# Patient Record
Sex: Female | Born: 1960 | Race: White | Hispanic: No | Marital: Married | Smoking: Never smoker
Health system: Southern US, Community
[De-identification: ages and names within clinical notes are randomized; demographics above are authoritative.]

## PROBLEM LIST (undated history)

## (undated) DIAGNOSIS — E039 Hypothyroidism, unspecified: Secondary | ICD-10-CM

## (undated) DIAGNOSIS — R519 Headache, unspecified: Secondary | ICD-10-CM

## (undated) DIAGNOSIS — R51 Headache: Secondary | ICD-10-CM

## (undated) HISTORY — PX: CHOLECYSTECTOMY: SHX55

---

## 2005-04-16 ENCOUNTER — Ambulatory Visit: Payer: Self-pay | Admitting: Family Medicine

## 2006-01-28 ENCOUNTER — Ambulatory Visit: Payer: Self-pay | Admitting: Family Medicine

## 2006-02-23 ENCOUNTER — Other Ambulatory Visit: Admission: RE | Admit: 2006-02-23 | Discharge: 2006-02-23 | Payer: Self-pay | Admitting: Obstetrics and Gynecology

## 2016-06-02 DIAGNOSIS — N289 Disorder of kidney and ureter, unspecified: Secondary | ICD-10-CM | POA: Diagnosis not present

## 2016-06-02 DIAGNOSIS — E039 Hypothyroidism, unspecified: Secondary | ICD-10-CM | POA: Diagnosis not present

## 2016-06-02 DIAGNOSIS — Z1389 Encounter for screening for other disorder: Secondary | ICD-10-CM | POA: Diagnosis not present

## 2016-06-02 DIAGNOSIS — M791 Myalgia: Secondary | ICD-10-CM | POA: Diagnosis not present

## 2016-06-02 DIAGNOSIS — G4700 Insomnia, unspecified: Secondary | ICD-10-CM | POA: Diagnosis not present

## 2016-07-01 DIAGNOSIS — R300 Dysuria: Secondary | ICD-10-CM | POA: Diagnosis not present

## 2016-07-01 DIAGNOSIS — Z79890 Hormone replacement therapy (postmenopausal): Secondary | ICD-10-CM | POA: Diagnosis not present

## 2016-07-03 DIAGNOSIS — R51 Headache: Secondary | ICD-10-CM | POA: Diagnosis not present

## 2016-07-03 DIAGNOSIS — J3489 Other specified disorders of nose and nasal sinuses: Secondary | ICD-10-CM | POA: Diagnosis not present

## 2016-07-03 DIAGNOSIS — J329 Chronic sinusitis, unspecified: Secondary | ICD-10-CM | POA: Diagnosis not present

## 2016-07-03 DIAGNOSIS — J343 Hypertrophy of nasal turbinates: Secondary | ICD-10-CM | POA: Diagnosis not present

## 2016-07-03 DIAGNOSIS — J342 Deviated nasal septum: Secondary | ICD-10-CM | POA: Diagnosis not present

## 2016-07-06 DIAGNOSIS — E039 Hypothyroidism, unspecified: Secondary | ICD-10-CM | POA: Diagnosis not present

## 2016-07-31 DIAGNOSIS — J3489 Other specified disorders of nose and nasal sinuses: Secondary | ICD-10-CM | POA: Diagnosis not present

## 2016-07-31 DIAGNOSIS — J329 Chronic sinusitis, unspecified: Secondary | ICD-10-CM | POA: Diagnosis not present

## 2016-07-31 DIAGNOSIS — J324 Chronic pansinusitis: Secondary | ICD-10-CM | POA: Diagnosis not present

## 2016-08-10 DIAGNOSIS — J342 Deviated nasal septum: Secondary | ICD-10-CM | POA: Diagnosis not present

## 2016-08-10 DIAGNOSIS — R0981 Nasal congestion: Secondary | ICD-10-CM | POA: Diagnosis not present

## 2016-08-10 DIAGNOSIS — J3489 Other specified disorders of nose and nasal sinuses: Secondary | ICD-10-CM | POA: Diagnosis not present

## 2016-08-10 DIAGNOSIS — R51 Headache: Secondary | ICD-10-CM | POA: Diagnosis not present

## 2016-09-08 DIAGNOSIS — E559 Vitamin D deficiency, unspecified: Secondary | ICD-10-CM | POA: Diagnosis not present

## 2016-09-08 DIAGNOSIS — G4700 Insomnia, unspecified: Secondary | ICD-10-CM | POA: Diagnosis not present

## 2016-09-08 DIAGNOSIS — R5381 Other malaise: Secondary | ICD-10-CM | POA: Diagnosis not present

## 2016-09-08 DIAGNOSIS — E039 Hypothyroidism, unspecified: Secondary | ICD-10-CM | POA: Diagnosis not present

## 2016-10-07 DIAGNOSIS — N3001 Acute cystitis with hematuria: Secondary | ICD-10-CM | POA: Diagnosis not present

## 2016-12-15 DIAGNOSIS — E039 Hypothyroidism, unspecified: Secondary | ICD-10-CM | POA: Diagnosis not present

## 2016-12-15 DIAGNOSIS — E785 Hyperlipidemia, unspecified: Secondary | ICD-10-CM | POA: Diagnosis not present

## 2016-12-15 DIAGNOSIS — Z1231 Encounter for screening mammogram for malignant neoplasm of breast: Secondary | ICD-10-CM | POA: Diagnosis not present

## 2016-12-15 DIAGNOSIS — G4700 Insomnia, unspecified: Secondary | ICD-10-CM | POA: Diagnosis not present

## 2016-12-24 DIAGNOSIS — Z1231 Encounter for screening mammogram for malignant neoplasm of breast: Secondary | ICD-10-CM | POA: Diagnosis not present

## 2017-02-26 DIAGNOSIS — F329 Major depressive disorder, single episode, unspecified: Secondary | ICD-10-CM | POA: Diagnosis not present

## 2017-02-26 DIAGNOSIS — E039 Hypothyroidism, unspecified: Secondary | ICD-10-CM | POA: Diagnosis not present

## 2017-02-26 DIAGNOSIS — M25511 Pain in right shoulder: Secondary | ICD-10-CM | POA: Diagnosis not present

## 2017-02-26 DIAGNOSIS — G4700 Insomnia, unspecified: Secondary | ICD-10-CM | POA: Diagnosis not present

## 2017-03-30 DIAGNOSIS — M7501 Adhesive capsulitis of right shoulder: Secondary | ICD-10-CM | POA: Diagnosis not present

## 2017-04-15 DIAGNOSIS — E039 Hypothyroidism, unspecified: Secondary | ICD-10-CM | POA: Diagnosis not present

## 2017-04-15 DIAGNOSIS — E785 Hyperlipidemia, unspecified: Secondary | ICD-10-CM | POA: Diagnosis not present

## 2017-04-15 DIAGNOSIS — M791 Myalgia: Secondary | ICD-10-CM | POA: Diagnosis not present

## 2017-04-15 DIAGNOSIS — N289 Disorder of kidney and ureter, unspecified: Secondary | ICD-10-CM | POA: Diagnosis not present

## 2017-04-15 DIAGNOSIS — N3090 Cystitis, unspecified without hematuria: Secondary | ICD-10-CM | POA: Diagnosis not present

## 2017-05-06 DIAGNOSIS — M7501 Adhesive capsulitis of right shoulder: Secondary | ICD-10-CM | POA: Diagnosis not present

## 2017-05-18 DIAGNOSIS — M25611 Stiffness of right shoulder, not elsewhere classified: Secondary | ICD-10-CM | POA: Diagnosis not present

## 2017-05-18 DIAGNOSIS — M25511 Pain in right shoulder: Secondary | ICD-10-CM | POA: Diagnosis not present

## 2017-05-21 DIAGNOSIS — M25511 Pain in right shoulder: Secondary | ICD-10-CM | POA: Diagnosis not present

## 2017-05-21 DIAGNOSIS — M25611 Stiffness of right shoulder, not elsewhere classified: Secondary | ICD-10-CM | POA: Diagnosis not present

## 2017-05-24 DIAGNOSIS — M25611 Stiffness of right shoulder, not elsewhere classified: Secondary | ICD-10-CM | POA: Diagnosis not present

## 2017-05-24 DIAGNOSIS — M25511 Pain in right shoulder: Secondary | ICD-10-CM | POA: Diagnosis not present

## 2017-05-27 DIAGNOSIS — M25511 Pain in right shoulder: Secondary | ICD-10-CM | POA: Diagnosis not present

## 2017-05-27 DIAGNOSIS — M25611 Stiffness of right shoulder, not elsewhere classified: Secondary | ICD-10-CM | POA: Diagnosis not present

## 2017-06-02 DIAGNOSIS — M25611 Stiffness of right shoulder, not elsewhere classified: Secondary | ICD-10-CM | POA: Diagnosis not present

## 2017-06-02 DIAGNOSIS — M25511 Pain in right shoulder: Secondary | ICD-10-CM | POA: Diagnosis not present

## 2017-06-04 DIAGNOSIS — M25511 Pain in right shoulder: Secondary | ICD-10-CM | POA: Diagnosis not present

## 2017-06-04 DIAGNOSIS — M25611 Stiffness of right shoulder, not elsewhere classified: Secondary | ICD-10-CM | POA: Diagnosis not present

## 2017-06-09 DIAGNOSIS — M25511 Pain in right shoulder: Secondary | ICD-10-CM | POA: Diagnosis not present

## 2017-06-09 DIAGNOSIS — M25611 Stiffness of right shoulder, not elsewhere classified: Secondary | ICD-10-CM | POA: Diagnosis not present

## 2017-06-11 DIAGNOSIS — M25511 Pain in right shoulder: Secondary | ICD-10-CM | POA: Diagnosis not present

## 2017-06-11 DIAGNOSIS — M25611 Stiffness of right shoulder, not elsewhere classified: Secondary | ICD-10-CM | POA: Diagnosis not present

## 2017-06-16 DIAGNOSIS — M25611 Stiffness of right shoulder, not elsewhere classified: Secondary | ICD-10-CM | POA: Diagnosis not present

## 2017-06-16 DIAGNOSIS — M25511 Pain in right shoulder: Secondary | ICD-10-CM | POA: Diagnosis not present

## 2017-06-17 DIAGNOSIS — M7501 Adhesive capsulitis of right shoulder: Secondary | ICD-10-CM | POA: Diagnosis not present

## 2017-06-24 DIAGNOSIS — Z01419 Encounter for gynecological examination (general) (routine) without abnormal findings: Secondary | ICD-10-CM | POA: Diagnosis not present

## 2017-10-11 DIAGNOSIS — R300 Dysuria: Secondary | ICD-10-CM | POA: Diagnosis not present

## 2017-10-11 DIAGNOSIS — N3090 Cystitis, unspecified without hematuria: Secondary | ICD-10-CM | POA: Diagnosis not present

## 2017-11-01 DIAGNOSIS — Z2821 Immunization not carried out because of patient refusal: Secondary | ICD-10-CM | POA: Diagnosis not present

## 2017-11-01 DIAGNOSIS — Z1331 Encounter for screening for depression: Secondary | ICD-10-CM | POA: Diagnosis not present

## 2017-11-01 DIAGNOSIS — E559 Vitamin D deficiency, unspecified: Secondary | ICD-10-CM | POA: Diagnosis not present

## 2017-11-01 DIAGNOSIS — E663 Overweight: Secondary | ICD-10-CM | POA: Diagnosis not present

## 2017-11-01 DIAGNOSIS — E785 Hyperlipidemia, unspecified: Secondary | ICD-10-CM | POA: Diagnosis not present

## 2018-02-08 DIAGNOSIS — Z1231 Encounter for screening mammogram for malignant neoplasm of breast: Secondary | ICD-10-CM | POA: Diagnosis not present

## 2018-02-08 DIAGNOSIS — E785 Hyperlipidemia, unspecified: Secondary | ICD-10-CM | POA: Diagnosis not present

## 2018-02-08 DIAGNOSIS — E039 Hypothyroidism, unspecified: Secondary | ICD-10-CM | POA: Diagnosis not present

## 2018-02-08 DIAGNOSIS — Z2821 Immunization not carried out because of patient refusal: Secondary | ICD-10-CM | POA: Diagnosis not present

## 2018-02-15 DIAGNOSIS — K7689 Other specified diseases of liver: Secondary | ICD-10-CM | POA: Diagnosis not present

## 2018-02-28 DIAGNOSIS — Z1231 Encounter for screening mammogram for malignant neoplasm of breast: Secondary | ICD-10-CM | POA: Diagnosis not present

## 2018-03-21 DIAGNOSIS — R12 Heartburn: Secondary | ICD-10-CM | POA: Diagnosis not present

## 2018-03-21 DIAGNOSIS — K210 Gastro-esophageal reflux disease with esophagitis: Secondary | ICD-10-CM | POA: Diagnosis not present

## 2018-03-21 DIAGNOSIS — K219 Gastro-esophageal reflux disease without esophagitis: Secondary | ICD-10-CM | POA: Diagnosis not present

## 2018-04-18 DIAGNOSIS — K219 Gastro-esophageal reflux disease without esophagitis: Secondary | ICD-10-CM | POA: Diagnosis not present

## 2018-05-24 DIAGNOSIS — H1131 Conjunctival hemorrhage, right eye: Secondary | ICD-10-CM | POA: Diagnosis not present

## 2018-05-24 DIAGNOSIS — N390 Urinary tract infection, site not specified: Secondary | ICD-10-CM | POA: Diagnosis not present

## 2018-05-24 DIAGNOSIS — R1013 Epigastric pain: Secondary | ICD-10-CM | POA: Diagnosis not present

## 2018-05-24 DIAGNOSIS — R748 Abnormal levels of other serum enzymes: Secondary | ICD-10-CM | POA: Diagnosis not present

## 2018-05-31 DIAGNOSIS — R748 Abnormal levels of other serum enzymes: Secondary | ICD-10-CM | POA: Diagnosis not present

## 2018-05-31 DIAGNOSIS — R945 Abnormal results of liver function studies: Secondary | ICD-10-CM | POA: Diagnosis not present

## 2018-06-08 DIAGNOSIS — K3189 Other diseases of stomach and duodenum: Secondary | ICD-10-CM | POA: Diagnosis not present

## 2018-06-08 DIAGNOSIS — K2970 Gastritis, unspecified, without bleeding: Secondary | ICD-10-CM | POA: Diagnosis not present

## 2018-06-08 DIAGNOSIS — R1013 Epigastric pain: Secondary | ICD-10-CM | POA: Diagnosis not present

## 2018-06-09 DIAGNOSIS — R748 Abnormal levels of other serum enzymes: Secondary | ICD-10-CM | POA: Diagnosis not present

## 2018-08-04 DIAGNOSIS — R1013 Epigastric pain: Secondary | ICD-10-CM | POA: Diagnosis not present

## 2018-08-04 DIAGNOSIS — R748 Abnormal levels of other serum enzymes: Secondary | ICD-10-CM | POA: Diagnosis not present

## 2018-08-19 DIAGNOSIS — Z01419 Encounter for gynecological examination (general) (routine) without abnormal findings: Secondary | ICD-10-CM | POA: Diagnosis not present

## 2018-08-19 DIAGNOSIS — Z1231 Encounter for screening mammogram for malignant neoplasm of breast: Secondary | ICD-10-CM | POA: Diagnosis not present

## 2018-08-25 DIAGNOSIS — E785 Hyperlipidemia, unspecified: Secondary | ICD-10-CM | POA: Diagnosis not present

## 2018-08-25 DIAGNOSIS — G2581 Restless legs syndrome: Secondary | ICD-10-CM | POA: Diagnosis not present

## 2018-08-25 DIAGNOSIS — E039 Hypothyroidism, unspecified: Secondary | ICD-10-CM | POA: Diagnosis not present

## 2018-08-25 DIAGNOSIS — K219 Gastro-esophageal reflux disease without esophagitis: Secondary | ICD-10-CM | POA: Diagnosis not present

## 2018-09-13 DIAGNOSIS — R1013 Epigastric pain: Secondary | ICD-10-CM | POA: Diagnosis not present

## 2018-09-13 DIAGNOSIS — K831 Obstruction of bile duct: Secondary | ICD-10-CM | POA: Diagnosis not present

## 2018-09-13 DIAGNOSIS — R112 Nausea with vomiting, unspecified: Secondary | ICD-10-CM | POA: Diagnosis not present

## 2018-09-13 DIAGNOSIS — R1110 Vomiting, unspecified: Secondary | ICD-10-CM | POA: Diagnosis not present

## 2018-09-14 ENCOUNTER — Other Ambulatory Visit: Payer: Self-pay

## 2018-09-14 ENCOUNTER — Observation Stay (HOSPITAL_COMMUNITY)
Admission: EM | Admit: 2018-09-14 | Discharge: 2018-09-17 | DRG: 439 | Disposition: A | Discharge status: Home / Self Care | Payer: BLUE CROSS/BLUE SHIELD | Source: Other Acute Inpatient Hospital | Attending: Gastroenterology | Admitting: Gastroenterology

## 2018-09-14 ENCOUNTER — Encounter (HOSPITAL_COMMUNITY): Payer: Self-pay | Admitting: Family Medicine

## 2018-09-14 ENCOUNTER — Inpatient Hospital Stay (HOSPITAL_COMMUNITY): Payer: BLUE CROSS/BLUE SHIELD

## 2018-09-14 DIAGNOSIS — K851 Biliary acute pancreatitis without necrosis or infection: Secondary | ICD-10-CM | POA: Diagnosis not present

## 2018-09-14 DIAGNOSIS — R109 Unspecified abdominal pain: Secondary | ICD-10-CM | POA: Insufficient documentation

## 2018-09-14 DIAGNOSIS — K8071 Calculus of gallbladder and bile duct without cholecystitis with obstruction: Secondary | ICD-10-CM | POA: Diagnosis present

## 2018-09-14 DIAGNOSIS — IMO0002 Reserved for concepts with insufficient information to code with codable children: Secondary | ICD-10-CM | POA: Diagnosis present

## 2018-09-14 DIAGNOSIS — K859 Acute pancreatitis without necrosis or infection, unspecified: Secondary | ICD-10-CM

## 2018-09-14 DIAGNOSIS — N183 Chronic kidney disease, stage 3 unspecified: Secondary | ICD-10-CM | POA: Diagnosis present

## 2018-09-14 DIAGNOSIS — R000 Tachycardia, unspecified: Secondary | ICD-10-CM | POA: Diagnosis not present

## 2018-09-14 DIAGNOSIS — G43709 Chronic migraine without aura, not intractable, without status migrainosus: Secondary | ICD-10-CM

## 2018-09-14 DIAGNOSIS — G43909 Migraine, unspecified, not intractable, without status migrainosus: Secondary | ICD-10-CM | POA: Diagnosis not present

## 2018-09-14 DIAGNOSIS — K8050 Calculus of bile duct without cholangitis or cholecystitis without obstruction: Principal | ICD-10-CM | POA: Diagnosis present

## 2018-09-14 DIAGNOSIS — Z9889 Other specified postprocedural states: Secondary | ICD-10-CM

## 2018-09-14 DIAGNOSIS — E039 Hypothyroidism, unspecified: Secondary | ICD-10-CM | POA: Diagnosis present

## 2018-09-14 DIAGNOSIS — M797 Fibromyalgia: Secondary | ICD-10-CM | POA: Diagnosis present

## 2018-09-14 DIAGNOSIS — R7989 Other specified abnormal findings of blood chemistry: Secondary | ICD-10-CM | POA: Diagnosis not present

## 2018-09-14 DIAGNOSIS — Z881 Allergy status to other antibiotic agents status: Secondary | ICD-10-CM

## 2018-09-14 DIAGNOSIS — K831 Obstruction of bile duct: Secondary | ICD-10-CM | POA: Diagnosis present

## 2018-09-14 DIAGNOSIS — G4700 Insomnia, unspecified: Secondary | ICD-10-CM | POA: Diagnosis present

## 2018-09-14 DIAGNOSIS — R740 Nonspecific elevation of levels of transaminase and lactic acid dehydrogenase [LDH]: Secondary | ICD-10-CM | POA: Diagnosis present

## 2018-09-14 DIAGNOSIS — G43809 Other migraine, not intractable, without status migrainosus: Secondary | ICD-10-CM | POA: Diagnosis not present

## 2018-09-14 DIAGNOSIS — R1013 Epigastric pain: Secondary | ICD-10-CM | POA: Diagnosis not present

## 2018-09-14 DIAGNOSIS — Z9049 Acquired absence of other specified parts of digestive tract: Secondary | ICD-10-CM | POA: Diagnosis not present

## 2018-09-14 DIAGNOSIS — K838 Other specified diseases of biliary tract: Secondary | ICD-10-CM | POA: Diagnosis not present

## 2018-09-14 DIAGNOSIS — R935 Abnormal findings on diagnostic imaging of other abdominal regions, including retroperitoneum: Secondary | ICD-10-CM | POA: Diagnosis not present

## 2018-09-14 HISTORY — DX: Hypothyroidism, unspecified: E03.9

## 2018-09-14 HISTORY — DX: Headache: R51

## 2018-09-14 HISTORY — DX: Headache, unspecified: R51.9

## 2018-09-14 LAB — CBC WITH DIFFERENTIAL/PLATELET
ABS IMMATURE GRANULOCYTES: 0.1 10*3/uL (ref 0.0–0.1)
Basophils Absolute: 0.1 10*3/uL (ref 0.0–0.1)
Basophils Relative: 1 %
EOS ABS: 0.1 10*3/uL (ref 0.0–0.7)
Eosinophils Relative: 1 %
HEMATOCRIT: 49 % — AB (ref 36.0–46.0)
Hemoglobin: 16.3 g/dL — ABNORMAL HIGH (ref 12.0–15.0)
IMMATURE GRANULOCYTES: 1 %
LYMPHS PCT: 9 %
Lymphs Abs: 1 10*3/uL (ref 0.7–4.0)
MCH: 32 pg (ref 26.0–34.0)
MCHC: 33.3 g/dL (ref 30.0–36.0)
MCV: 96.1 fL (ref 78.0–100.0)
Monocytes Absolute: 1.1 10*3/uL — ABNORMAL HIGH (ref 0.1–1.0)
Monocytes Relative: 10 %
NEUTROS PCT: 80 %
Neutro Abs: 8.8 10*3/uL — ABNORMAL HIGH (ref 1.7–7.7)
Platelets: 268 10*3/uL (ref 150–400)
RBC: 5.1 MIL/uL (ref 3.87–5.11)
RDW: 12 % (ref 11.5–15.5)
WBC: 11 10*3/uL — AB (ref 4.0–10.5)

## 2018-09-14 LAB — COMPREHENSIVE METABOLIC PANEL
ALT: 1000 U/L — AB (ref 0–44)
AST: 890 U/L — AB (ref 15–41)
Albumin: 3.9 g/dL (ref 3.5–5.0)
Alkaline Phosphatase: 273 U/L — ABNORMAL HIGH (ref 38–126)
Anion gap: 13 (ref 5–15)
BUN: 8 mg/dL (ref 6–20)
CO2: 22 mmol/L (ref 22–32)
CREATININE: 1.02 mg/dL — AB (ref 0.44–1.00)
Calcium: 9.2 mg/dL (ref 8.9–10.3)
Chloride: 105 mmol/L (ref 98–111)
GFR calc non Af Amer: >60 mL/min (ref >60)
Glucose, Bld: 118 mg/dL — ABNORMAL HIGH (ref 70–99)
Potassium: 4.1 mmol/L (ref 3.5–5.1)
Sodium: 140 mmol/L (ref 135–145)
Total Bilirubin: 3 mg/dL — ABNORMAL HIGH (ref 0.3–1.2)
Total Protein: 6.7 g/dL (ref 6.5–8.1)

## 2018-09-14 LAB — HIV ANTIBODY (ROUTINE TESTING W REFLEX): HIV SCREEN 4TH GENERATION: NONREACTIVE

## 2018-09-14 LAB — GLUCOSE, CAPILLARY: Glucose-Capillary: 121 mg/dL — ABNORMAL HIGH (ref 70–99)

## 2018-09-14 LAB — ACETAMINOPHEN LEVEL

## 2018-09-14 MED ORDER — ZOLPIDEM TARTRATE 5 MG PO TABS: 5.0000 mg | ORAL_TABLET | Freq: Every evening | ORAL | Status: DC | PRN

## 2018-09-14 MED ORDER — OXYCODONE HCL 5 MG PO TABS
5.0000 mg | ORAL_TABLET | ORAL | Status: DC | PRN
  Administered 2018-09-14 – 2018-09-15 (×5): 5 mg via ORAL
  Filled 2018-09-14 (×6): qty 1

## 2018-09-14 MED ORDER — SODIUM CHLORIDE 0.9 % IV SOLN
INTRAVENOUS | Status: DC
  Administered 2018-09-14: 01:00:00 via INTRAVENOUS

## 2018-09-14 MED ORDER — TOPIRAMATE 25 MG PO TABS
50.0000 mg | ORAL_TABLET | Freq: Two times a day (BID) | ORAL | Status: DC
  Administered 2018-09-14 – 2018-09-17 (×7): 50 mg via ORAL
  Filled 2018-09-14 (×7): qty 2

## 2018-09-14 MED ORDER — ONDANSETRON HCL 4 MG PO TABS: 4.0000 mg | ORAL_TABLET | Freq: Four times a day (QID) | ORAL | Status: DC | PRN

## 2018-09-14 MED ORDER — FENTANYL CITRATE (PF) 100 MCG/2ML IJ SOLN
25.0000 ug | INTRAMUSCULAR | Status: DC | PRN
  Administered 2018-09-14 (×5): 50 ug via INTRAVENOUS
  Filled 2018-09-14 (×5): qty 2

## 2018-09-14 MED ORDER — KCL IN DEXTROSE-NACL 20-5-0.45 MEQ/L-%-% IV SOLN
INTRAVENOUS | Status: DC
  Administered 2018-09-14 – 2018-09-15 (×3): via INTRAVENOUS
  Filled 2018-09-14 (×3): qty 1000

## 2018-09-14 MED ORDER — THYROID 60 MG PO TABS
90.0000 mg | ORAL_TABLET | Freq: Every day | ORAL | Status: DC
  Administered 2018-09-14 – 2018-09-17 (×4): 90 mg via ORAL
  Filled 2018-09-14 (×5): qty 1

## 2018-09-14 MED ORDER — ACETAMINOPHEN 325 MG PO TABS
650.0000 mg | ORAL_TABLET | Freq: Four times a day (QID) | ORAL | Status: DC | PRN
  Administered 2018-09-16: 650 mg via ORAL
  Filled 2018-09-14: qty 2

## 2018-09-14 MED ORDER — THYROID 30 MG PO TABS
30.0000 mg | ORAL_TABLET | Freq: Every day | ORAL | Status: DC
  Filled 2018-09-14: qty 1

## 2018-09-14 MED ORDER — ONDANSETRON HCL 4 MG/2ML IJ SOLN
4.0000 mg | Freq: Four times a day (QID) | INTRAMUSCULAR | Status: DC | PRN
  Administered 2018-09-15: 4 mg via INTRAVENOUS
  Filled 2018-09-14 (×2): qty 2

## 2018-09-14 MED ORDER — GADOBUTROL 1 MMOL/ML IV SOLN
9.0000 mL | Freq: Once | INTRAVENOUS | Status: AC | PRN
  Administered 2018-09-14: 7.5 mL via INTRAVENOUS

## 2018-09-14 MED ORDER — ACETAMINOPHEN 650 MG RE SUPP: 650.0000 mg | Freq: Four times a day (QID) | RECTAL | Status: DC | PRN

## 2018-09-14 MED ORDER — HYDROMORPHONE HCL 1 MG/ML IJ SOLN
0.5000 mg | INTRAMUSCULAR | Status: DC | PRN
  Administered 2018-09-14 – 2018-09-15 (×3): 0.5 mg via INTRAVENOUS
  Filled 2018-09-14 (×3): qty 1

## 2018-09-14 NOTE — Progress Notes (Signed)
Pt arrived to floor,transfer from [facility]. Pt alert and oriented x4. Skin intact. Just had Dilaudid 1mg  IV prior to transfer. No nausea. Oriented to room and call bell.

## 2018-09-14 NOTE — Consult Note (Signed)
Referring Provider: [place] Primary Care Physician:  Lowella Dandy, NP Primary Gastroenterologist:  Dr. Melina Copa   Reason for Consultation: CBD stone  HPI: Abigail Sexton is a 57 y.o. female transferred from [facility] to [facility] for further evaluation of abdominal pain.  Patient with intermittent abdominal pain, nausea and vomiting.  Because of worsening symptoms, she was seen in the emergency room at end of hospital.  CT scan showed new intra-and extrahepatic ductal dilatation.  Along with elevated AST ALT in the range of 500, T bili of 1.5, alk phos 233.  Normal lipase.  Because of lack of GI coverage, she was transferred here.  Patient seen and examined at bedside.  She is complaining of intermittent epigastric abdominal pain with radiation towards the back since February.  Associated with 3 episodes of nausea and nonbloody vomiting.  She had EGD to evaluate the symptoms in June or July with Dr. Melina Copa which was normal.  She denies any diarrhea, constipation.  Denies blood in stool or black stool.   MRI MRCP today showed mild acute pancreatitis , diffusely dilated CBD of  Up to 14 mm as well as two  distal common bile duct stones measuring up to 1 cm.   Past Medical History:  Diagnosis Date  . Headache   . Hypothyroidism     Past Surgical History:  Procedure Laterality Date  . CHOLECYSTECTOMY      Prior to Admission medications   Not on File    Scheduled Meds: . thyroid  90 mg Oral QAC breakfast  . topiramate  50 mg Oral BID   Continuous Infusions: . dextrose 5 % and 0.45 % NaCl with KCl 20 mEq/L 125 mL/hr at 09/14/18 1254   PRN Meds:.acetaminophen **OR** acetaminophen, fentaNYL (SUBLIMAZE) injection, ondansetron **OR** ondansetron (ZOFRAN) IV, zolpidem  Allergies as of 09/13/2018  . (Not on File)    History reviewed. No pertinent family history.  Social History   Socioeconomic History  . Marital status: Married    Spouse name: Not on file  . Number of children:  Not on file  . Years of education: Not on file  . Highest education level: Not on file  Occupational History  . Not on file  Social Needs  . Financial resource strain: Not on file  . Food insecurity:    Worry: Not on file    Inability: Not on file  . Transportation needs:    Medical: Not on file    Non-medical: Not on file  Tobacco Use  . Smoking status: Not on file  Substance and Sexual Activity  . Alcohol use: Not on file  . Drug use: Not on file  . Sexual activity: Not on file  Lifestyle  . Physical activity:    Days per week: Not on file    Minutes per session: Not on file  . Stress: Not on file  Relationships  . Social connections:    Talks on phone: Not on file    Gets together: Not on file    Attends religious service: Not on file    Active member of club or organization: Not on file    Attends meetings of clubs or organizations: Not on file    Relationship status: Not on file  . Intimate partner violence:    Fear of current or ex partner: Not on file    Emotionally abused: Not on file    Physically abused: Not on file    Forced sexual activity: Not  on file  Other Topics Concern  . Not on file  Social History Narrative  . Not on file    Review of Systems: Review of Systems  Constitutional: Negative for chills and fever.  HENT: Negative for hearing loss and tinnitus.   Eyes: Negative for blurred vision and double vision.  Respiratory: Negative for cough and hemoptysis.   Cardiovascular: Negative for chest pain and palpitations.  Gastrointestinal: Positive for abdominal pain, heartburn, nausea and vomiting. Negative for blood in stool, constipation and diarrhea.  Genitourinary: Negative for dysuria and urgency.  Musculoskeletal: Positive for back pain. Negative for myalgias and neck pain.  Skin: Negative for itching and rash.  Neurological: Negative for seizures and loss of consciousness.  Endo/Heme/Allergies: Does not bruise/bleed easily.   Psychiatric/Behavioral: Negative for hallucinations and substance abuse.    Physical Exam: Vital signs: Vitals:   09/14/18 0436 09/14/18 1001  BP: 139/90 (!) 153/99  Pulse: 91 (!) 102  Resp: 16 17  Temp: 98.1 F (36.7 C) 98.4 F (36.9 C)  SpO2: 100% 100%   Last BM Date: 09/13/18 Physical Exam  Constitutional: She is oriented to person, place, and time. She appears well-developed and well-nourished. No distress.  HENT:  Head: Normocephalic and atraumatic.  Mouth/Throat: Oropharynx is clear and moist. No oropharyngeal exudate.  Eyes: EOM are normal. No scleral icterus.  Neck: Normal range of motion. Neck supple.  Cardiovascular: Normal rate, regular rhythm and normal heart sounds.  Pulmonary/Chest: Effort normal and breath sounds normal.  Anterior exam only  Abdominal: Soft. Bowel sounds are normal. She exhibits no distension. There is tenderness. There is no rebound and no guarding.  Epigastric tenderness to palpation  Musculoskeletal: Normal range of motion. She exhibits no edema.  Neurological: She is alert and oriented to person, place, and time.  Skin: Skin is warm. No erythema.  Psychiatric: She has a normal mood and affect. Judgment normal.  Vitals reviewed.  GI:  Lab Results: Recent Labs    09/14/18 0449  WBC 11.0*  HGB 16.3*  HCT 49.0*  PLT 268   BMET Recent Labs    09/14/18 0449  NA 140  K 4.1  CL 105  CO2 22  GLUCOSE 118*  BUN 8  CREATININE 1.02*  CALCIUM 9.2   LFT Recent Labs    09/14/18 0449  PROT 6.7  ALBUMIN 3.9  AST 890*  ALT 1,000*  ALKPHOS 273*  BILITOT 3.0*   PT/INR No results for input(s): LABPROT, INR in the last 72 hours.   Studies/Results: Mr 3d Recon At Scanner  Result Date: 09/14/2018 CLINICAL DATA:  Worsening epigastric pain and nausea with vomiting for 1 month. Biliary ductal dilatation on recent CT. Prior cholecystectomy. EXAM: MRI ABDOMEN WITHOUT AND WITH CONTRAST (INCLUDING MRCP) TECHNIQUE: Multiplanar  multisequence MR imaging of the abdomen was performed both before and after the administration of intravenous contrast. Heavily T2-weighted images of the biliary and pancreatic ducts were obtained, and three-dimensional MRCP images were rendered by post processing. CONTRAST:  9 mL Gadavist COMPARISON:  CT on 09/13/2018 FINDINGS: Lower chest: No acute findings. Hepatobiliary: No hepatic masses identified. Prior cholecystectomy. Diffuse biliary ductal dilatation is seen, with common bile duct measuring 14 mm. At least 2 calculi are seen in the distal common bile duct measuring approximately 1 cm. Pancreas: Mild diffuse pancreatic swelling and peripancreatic edema, consistent with acute pancreatitis. No evidence of pancreatic necrosis or pseudocyst. No evidence of pancreatic mass or pancreatic ductal dilatation. Spleen:  Within normal limits in size and appearance.  Adrenals/Urinary Tract: No masses identified. No evidence of hydronephrosis. Stomach/Bowel: Visualized portion unremarkable. Vascular/Lymphatic: No pathologically enlarged lymph nodes identified. No abdominal aortic aneurysm. Other:  None. Musculoskeletal:  No suspicious bone lesions identified. IMPRESSION: Mild acute pancreatitis. No evidence of pancreatic necrosis or pseudocysts. Diffuse biliary ductal dilatation, with 2 calculi in the distal common bile duct measuring approximately 1 cm. Electronically Signed   By: Earle Gell M.D.   On: 09/14/2018 10:56   Mr Abdomen Mrcp Moise Boring Contast  Result Date: 09/14/2018 CLINICAL DATA:  Worsening epigastric pain and nausea with vomiting for 1 month. Biliary ductal dilatation on recent CT. Prior cholecystectomy. EXAM: MRI ABDOMEN WITHOUT AND WITH CONTRAST (INCLUDING MRCP) TECHNIQUE: Multiplanar multisequence MR imaging of the abdomen was performed both before and after the administration of intravenous contrast. Heavily T2-weighted images of the biliary and pancreatic ducts were obtained, and three-dimensional  MRCP images were rendered by post processing. CONTRAST:  9 mL Gadavist COMPARISON:  CT on 09/13/2018 FINDINGS: Lower chest: No acute findings. Hepatobiliary: No hepatic masses identified. Prior cholecystectomy. Diffuse biliary ductal dilatation is seen, with common bile duct measuring 14 mm. At least 2 calculi are seen in the distal common bile duct measuring approximately 1 cm. Pancreas: Mild diffuse pancreatic swelling and peripancreatic edema, consistent with acute pancreatitis. No evidence of pancreatic necrosis or pseudocyst. No evidence of pancreatic mass or pancreatic ductal dilatation. Spleen:  Within normal limits in size and appearance. Adrenals/Urinary Tract: No masses identified. No evidence of hydronephrosis. Stomach/Bowel: Visualized portion unremarkable. Vascular/Lymphatic: No pathologically enlarged lymph nodes identified. No abdominal aortic aneurysm. Other:  None. Musculoskeletal:  No suspicious bone lesions identified. IMPRESSION: Mild acute pancreatitis. No evidence of pancreatic necrosis or pseudocysts. Diffuse biliary ductal dilatation, with 2 calculi in the distal common bile duct measuring approximately 1 cm. Electronically Signed   By: Earle Gell M.D.   On: 09/14/2018 10:56    Impression/Plan: -Choledocholithiasis -2 approximately 1 cm stones in the distal common bile duct per MRI report. -Acute pancreatitis -Abnormal LFTs, AST 890, ALT 1000, Aphos 273 and T bili 3  Recommendations ------------------------- -ERCP tentatively for Friday.  Unfortunately, there is no anesthesia slot available for tomorrow. -It is unusual to see transaminases au to  thousand with common bile duct stones, I will get hepatitis panel and acetaminophen level.??  May had an episode of hypotension resulting in ischemic hepatitis -Recheck LFTs in the morning. -GI will follow -Full liquid diet for now.    LOS: 0 days   Otis Brace  MD, [place] 09/14/2018, 1:18 PM  Contact #  [phone]

## 2018-09-14 NOTE — Progress Notes (Signed)
PROGRESS NOTE    ASTA CORBRIDGE  ZOX:[id number] DOB: 1961-03-22 DOA: 09/14/2018 PCP: Hurshel Party, NP      Brief Narrative:  Mrs. Shiroma is a 57 y.o. F with hypothyroidism who presents with intermittent post-prandial epigastric pain, now with elevated LFTs and dilated CBD on imaging.    Assessment & Plan:  Gallstone pancreatitis MRCP today shows two 1 cm gallstones in CBD, mild pancreatitis. -NPO -IVF -Hydromorphone IV q4hrs for pain, oxycodone for pain, ondansetron for nausea -Consult GI, appreciate cares  Transaminitis Acetaminophen level normal, no reported ingestion.  Seems excessively high for gallstone. -Follow hepatitis panel -Trend LFTs  Hypothyroidism -Continue Armour  Other medications -Continue topiramate       DVT prophylaxis: SCDs Code Status: FULL Family Communication: Husband MDM and disposition Plan: This is a no charge note.  For further details, please see H&P by my partner Dr. Antionette Char from earlier today.  The below labs and imaging reports were reviewed and summarized above.    The patient was admitted with gallstone pancreatitis.      Subjective: Stil some mild epigastric pain.   Objective: Vitals:   09/14/18 0013 09/14/18 0436 09/14/18 1001 09/14/18 1511  BP: (!) 142/97 139/90 (!) 153/99 (!) 154/97  Pulse: 85 91 (!) 102 98  Resp: 18 16 17 15   Temp: (!) 97.3 F (36.3 C) 98.1 F (36.7 C) 98.4 F (36.9 C) 98.3 F (36.8 C)  TempSrc: Oral Oral Oral Oral  SpO2: 100% 100% 100% 98%  Weight: 88.9 kg     Height: 5\' 7"  (1.702 m)       Intake/Output Summary (Last 24 hours) at 09/14/2018 1653 Last data filed at 09/14/2018 1256 Gross per 24 hour  Intake 680.76 ml  Output 1300 ml  Net -619.24 ml   Filed Weights   09/14/18 0013  Weight: 88.9 kg    Examination: The patient was seen and examined.      Data Reviewed: I have personally reviewed following labs and imaging studies:  CBC: Recent Labs  Lab 09/14/18 0449  WBC 11.0*    NEUTROABS 8.8*  HGB 16.3*  HCT 49.0*  MCV 96.1  PLT 268   Basic Metabolic Panel: Recent Labs  Lab 09/14/18 0449  NA 140  K 4.1  CL 105  CO2 22  GLUCOSE 118*  BUN 8  CREATININE 1.02*  CALCIUM 9.2   GFR: Estimated Creatinine Clearance: 72.2 mL/min (A) (by C-G formula based on SCr of 1.02 mg/dL (H)). Liver Function Tests: Recent Labs  Lab 09/14/18 0449  AST 890*  ALT 1,000*  ALKPHOS 273*  BILITOT 3.0*  PROT 6.7  ALBUMIN 3.9   No results for input(s): LIPASE, AMYLASE in the last 168 hours. No results for input(s): AMMONIA in the last 168 hours. Coagulation Profile: No results for input(s): INR, PROTIME in the last 168 hours. Cardiac Enzymes: No results for input(s): CKTOTAL, CKMB, CKMBINDEX, TROPONINI in the last 168 hours. BNP (last 3 results) No results for input(s): PROBNP in the last 8760 hours. HbA1C: No results for input(s): HGBA1C in the last 72 hours. CBG: Recent Labs  Lab 09/14/18 0752  GLUCAP 121*   Lipid Profile: No results for input(s): CHOL, HDL, LDLCALC, TRIG, CHOLHDL, LDLDIRECT in the last 72 hours. Thyroid Function Tests: No results for input(s): TSH, T4TOTAL, FREET4, T3FREE, THYROIDAB in the last 72 hours. Anemia Panel: No results for input(s): VITAMINB12, FOLATE, FERRITIN, TIBC, IRON, RETICCTPCT in the last 72 hours. Urine analysis: No results found for: COLORURINE, APPEARANCEUR,  LABSPEC, PHURINE, GLUCOSEU, HGBUR, BILIRUBINUR, KETONESUR, PROTEINUR, UROBILINOGEN, NITRITE, LEUKOCYTESUR Sepsis Labs: @LABRCNTIP (procalcitonin:4,lacticacidven:4)  )No results found for this or any previous visit (from the past 240 hour(s)).       Radiology Studies: Mr 3d Recon At Scanner  Result Date: 09/14/2018 CLINICAL DATA:  Worsening epigastric pain and nausea with vomiting for 1 month. Biliary ductal dilatation on recent CT. Prior cholecystectomy. EXAM: MRI ABDOMEN WITHOUT AND WITH CONTRAST (INCLUDING MRCP) TECHNIQUE: Multiplanar multisequence MR  imaging of the abdomen was performed both before and after the administration of intravenous contrast. Heavily T2-weighted images of the biliary and pancreatic ducts were obtained, and three-dimensional MRCP images were rendered by post processing. CONTRAST:  9 mL Gadavist COMPARISON:  CT on 09/13/2018 FINDINGS: Lower chest: No acute findings. Hepatobiliary: No hepatic masses identified. Prior cholecystectomy. Diffuse biliary ductal dilatation is seen, with common bile duct measuring 14 mm. At least 2 calculi are seen in the distal common bile duct measuring approximately 1 cm. Pancreas: Mild diffuse pancreatic swelling and peripancreatic edema, consistent with acute pancreatitis. No evidence of pancreatic necrosis or pseudocyst. No evidence of pancreatic mass or pancreatic ductal dilatation. Spleen:  Within normal limits in size and appearance. Adrenals/Urinary Tract: No masses identified. No evidence of hydronephrosis. Stomach/Bowel: Visualized portion unremarkable. Vascular/Lymphatic: No pathologically enlarged lymph nodes identified. No abdominal aortic aneurysm. Other:  None. Musculoskeletal:  No suspicious bone lesions identified. IMPRESSION: Mild acute pancreatitis. No evidence of pancreatic necrosis or pseudocysts. Diffuse biliary ductal dilatation, with 2 calculi in the distal common bile duct measuring approximately 1 cm. Electronically Signed   By: Myles Rosenthal M.D.   On: 09/14/2018 10:56   Mr Abdomen Mrcp Vivien Rossetti Contast  Result Date: 09/14/2018 CLINICAL DATA:  Worsening epigastric pain and nausea with vomiting for 1 month. Biliary ductal dilatation on recent CT. Prior cholecystectomy. EXAM: MRI ABDOMEN WITHOUT AND WITH CONTRAST (INCLUDING MRCP) TECHNIQUE: Multiplanar multisequence MR imaging of the abdomen was performed both before and after the administration of intravenous contrast. Heavily T2-weighted images of the biliary and pancreatic ducts were obtained, and three-dimensional MRCP images were  rendered by post processing. CONTRAST:  9 mL Gadavist COMPARISON:  CT on 09/13/2018 FINDINGS: Lower chest: No acute findings. Hepatobiliary: No hepatic masses identified. Prior cholecystectomy. Diffuse biliary ductal dilatation is seen, with common bile duct measuring 14 mm. At least 2 calculi are seen in the distal common bile duct measuring approximately 1 cm. Pancreas: Mild diffuse pancreatic swelling and peripancreatic edema, consistent with acute pancreatitis. No evidence of pancreatic necrosis or pseudocyst. No evidence of pancreatic mass or pancreatic ductal dilatation. Spleen:  Within normal limits in size and appearance. Adrenals/Urinary Tract: No masses identified. No evidence of hydronephrosis. Stomach/Bowel: Visualized portion unremarkable. Vascular/Lymphatic: No pathologically enlarged lymph nodes identified. No abdominal aortic aneurysm. Other:  None. Musculoskeletal:  No suspicious bone lesions identified. IMPRESSION: Mild acute pancreatitis. No evidence of pancreatic necrosis or pseudocysts. Diffuse biliary ductal dilatation, with 2 calculi in the distal common bile duct measuring approximately 1 cm. Electronically Signed   By: Myles Rosenthal M.D.   On: 09/14/2018 10:56        Scheduled Meds: . thyroid  90 mg Oral QAC breakfast  . topiramate  50 mg Oral BID   Continuous Infusions: . dextrose 5 % and 0.45 % NaCl with KCl 20 mEq/L 125 mL/hr at 09/14/18 1254     LOS: 0 days    Time spent: 25 minutes    Alberteen Sam, MD [facility] 09/14/2018, 4:53 PM  Pager [phone] --- please page though AMION:  [email or web address] Password [email or web address] If 7PM-7AM, please contact night-coverage

## 2018-09-14 NOTE — H&P (Signed)
History and Physical    Abigail Sexton:[id number] DOB: [birth year] DOA: 09/14/2018  PCP: Hurshel Party, NP   Patient coming from: Home  Chief Complaint: upper abdominal pain, N/V   HPI: Abigail Sexton is a 57 y.o. female with medical history significant for hypothyroidism, insomnia, fibromyalgia, and chronic headaches, now presenting to the emergency department for evaluation of epigastric pain and nausea with nonbloody vomiting.  Patient reports that the symptoms initially developed in February, resolve spontaneously for a while, returned a few months later, was evaluated with upper endoscopy and MRI in June which were reportedly normal, and she was started on daily Protonix.  She reports that symptoms resolved until about a month ago with recurrent postprandial upper abdominal pain and nausea with nonbloody vomiting.  Symptoms have become more severe and more frequent in recent days and she now presents to the ED for evaluation of this.  She denies any chest pain, shortness of breath, melena, or hematochezia.  She reports being status post cholecystectomy in the 1990s.  Denies any alcohol or NSAID use.  [facility] ED Course: Upon arrival to the ED, patient is found to be afebrile, saturating well on room air, and with vitals otherwise stable.  Chest x-ray was negative for acute cardiopulmonary disease and CT of the abdomen and pelvis was notable for status post cholecystectomy and new intra-and extrahepatic ductal dilatation with no obstructing calculus or definite soft tissue mass identified, but notable for inflammation near the ampulla.  Chemistry panel is notable for a stable creatinine at 1.20, alkaline phosphatase of 233, and newly elevated transaminases to the 500 range with total bilirubin of 1.5.  CBC is unremarkable, troponin undetectable, and lipase normal.  Patient was treated with analgesia and antiemetics in the ED and her gastroenterologist was consulted.  There was no GI coverage  available at the outside facility and admission to [facility] was arranged for ongoing evaluation and management.  Review of Systems:  All other systems reviewed and apart from HPI, are negative.  Past Medical History:  Diagnosis Date  . Headache   . Hypothyroidism     Past Surgical History:  Procedure Laterality Date  . CHOLECYSTECTOMY       has no tobacco, alcohol, and drug history on file.  Allergies not on file  History reviewed. No pertinent family history.   Prior to Admission medications   Not on File    Physical Exam: Vitals:   09/14/18 0013  BP: (!) 142/97  Pulse: 85  Resp: 18  Temp: (!) 97.3 F (36.3 C)  TempSrc: Oral  SpO2: 100%  Weight: 88.9 kg  Height: 5\' 7"  (1.702 m)     Constitutional: NAD, calm  Eyes: PERTLA, lids and conjunctivae normal ENMT: Mucous membranes are moist. Posterior pharynx clear of any exudate or lesions.   Neck: normal, supple, no masses, no thyromegaly Respiratory: clear to auscultation bilaterally, no wheezing, no crackles. Normal respiratory effort.   Cardiovascular: S1 & S2 heard, regular rate and rhythm. No extremity edema.   Abdomen: No distension, soft, tender in epigastrium without rebound pain or guarding. Bowel sounds active.  Musculoskeletal: no clubbing / cyanosis. No joint deformity upper and lower extremities.    Skin: no significant rashes, lesions, ulcers. Warm, dry, well-perfused. Neurologic: CN 2-12 grossly intact. Sensation intact. Strength 5/5 in all 4 limbs.  Psychiatric: Alert and oriented x 3. Calm, cooperative.    Labs on Admission: I have personally reviewed following labs and imaging studies  CBC:  No results for input(s): WBC, NEUTROABS, HGB, HCT, MCV, PLT in the last 168 hours. Basic Metabolic Panel: No results for input(s): NA, K, CL, CO2, GLUCOSE, BUN, CREATININE, CALCIUM, MG, PHOS in the last 168 hours. GFR: CrCl cannot be calculated (No successful lab value found.). Liver Function  Tests: No results for input(s): AST, ALT, ALKPHOS, BILITOT, PROT, ALBUMIN in the last 168 hours. No results for input(s): LIPASE, AMYLASE in the last 168 hours. No results for input(s): AMMONIA in the last 168 hours. Coagulation Profile: No results for input(s): INR, PROTIME in the last 168 hours. Cardiac Enzymes: No results for input(s): CKTOTAL, CKMB, CKMBINDEX, TROPONINI in the last 168 hours. BNP (last 3 results) No results for input(s): PROBNP in the last 8760 hours. HbA1C: No results for input(s): HGBA1C in the last 72 hours. CBG: No results for input(s): GLUCAP in the last 168 hours. Lipid Profile: No results for input(s): CHOL, HDL, LDLCALC, TRIG, CHOLHDL, LDLDIRECT in the last 72 hours. Thyroid Function Tests: No results for input(s): TSH, T4TOTAL, FREET4, T3FREE, THYROIDAB in the last 72 hours. Anemia Panel: No results for input(s): VITAMINB12, FOLATE, FERRITIN, TIBC, IRON, RETICCTPCT in the last 72 hours. Urine analysis: No results found for: COLORURINE, APPEARANCEUR, LABSPEC, PHURINE, GLUCOSEU, HGBUR, BILIRUBINUR, KETONESUR, PROTEINUR, UROBILINOGEN, NITRITE, LEUKOCYTESUR Sepsis Labs: @LABRCNTIP (procalcitonin:4,lacticidven:4) )No results found for this or any previous visit (from the past 240 hour(s)).   Radiological Exams on Admission: No results found.  EKG: Not performed.  Assessment/Plan   1. Biliary obstruction  - Presents with recurrent epigastric pain, nausea, and vomiting  - Found to have new elevation in transaminases to 500-range, slight elevation in total bilirubin, and CT with new intra- and extrahepatic biliary ductal dilatation as well as inflammatory changes near the ampulla  - She is afebrile, there is no leukocytosis, and no obstructing stone or definite mass noted on CT  - GI is consulting and much appreciated, will follow-up on recommendations  - Plan to keep NPO for now, continue supportive care with IVF hydration, analgesia, and antiemetic, and  check MRCP    2. CKD stage III  - SCr is 1.20 in ED, consistent with her reported baseline  - Renally-dose medications, continue gentle IVF hydration while NPO    3. Hypothyroidism  - Continue replacement    4. Chronic migraine  - Continue suppression with Topomax    DVT prophylaxis: SCD's  Code Status: Full  Family Communication: Discussed with patient   Consults called: GI  Admission status: Inpatient     Briscoe Deutscher, MD [facility] Pager [phone]  If 7PM-7AM, please contact night-coverage [email or web address] Password [email or web address]  09/14/2018, 3:50 AM

## 2018-09-15 DIAGNOSIS — K831 Obstruction of bile duct: Secondary | ICD-10-CM | POA: Diagnosis not present

## 2018-09-15 DIAGNOSIS — R109 Unspecified abdominal pain: Secondary | ICD-10-CM | POA: Diagnosis not present

## 2018-09-15 DIAGNOSIS — K851 Biliary acute pancreatitis without necrosis or infection: Secondary | ICD-10-CM

## 2018-09-15 DIAGNOSIS — E039 Hypothyroidism, unspecified: Secondary | ICD-10-CM | POA: Diagnosis not present

## 2018-09-15 DIAGNOSIS — K8050 Calculus of bile duct without cholangitis or cholecystitis without obstruction: Secondary | ICD-10-CM | POA: Diagnosis not present

## 2018-09-15 DIAGNOSIS — R740 Nonspecific elevation of levels of transaminase and lactic acid dehydrogenase [LDH]: Secondary | ICD-10-CM

## 2018-09-15 DIAGNOSIS — R1013 Epigastric pain: Secondary | ICD-10-CM | POA: Diagnosis not present

## 2018-09-15 DIAGNOSIS — G43909 Migraine, unspecified, not intractable, without status migrainosus: Secondary | ICD-10-CM | POA: Diagnosis not present

## 2018-09-15 DIAGNOSIS — R7989 Other specified abnormal findings of blood chemistry: Secondary | ICD-10-CM | POA: Diagnosis not present

## 2018-09-15 DIAGNOSIS — G43809 Other migraine, not intractable, without status migrainosus: Secondary | ICD-10-CM | POA: Diagnosis not present

## 2018-09-15 DIAGNOSIS — Z9049 Acquired absence of other specified parts of digestive tract: Secondary | ICD-10-CM | POA: Diagnosis not present

## 2018-09-15 DIAGNOSIS — N183 Chronic kidney disease, stage 3 (moderate): Secondary | ICD-10-CM | POA: Diagnosis not present

## 2018-09-15 LAB — COMPREHENSIVE METABOLIC PANEL
ALBUMIN: 3.4 g/dL — AB (ref 3.5–5.0)
ALT: 625 U/L — AB (ref 0–44)
AST: 219 U/L — AB (ref 15–41)
Alkaline Phosphatase: 228 U/L — ABNORMAL HIGH (ref 38–126)
Anion gap: 10 (ref 5–15)
BUN: 7 mg/dL (ref 6–20)
CHLORIDE: 108 mmol/L (ref 98–111)
CO2: 16 mmol/L — AB (ref 22–32)
CREATININE: 1.12 mg/dL — AB (ref 0.44–1.00)
Calcium: 8.7 mg/dL — ABNORMAL LOW (ref 8.9–10.3)
GFR calc Af Amer: >60 mL/min (ref >60)
GFR, EST NON AFRICAN AMERICAN: 55 mL/min — AB (ref >60)
GLUCOSE: 130 mg/dL — AB (ref 70–99)
POTASSIUM: 3.4 mmol/L — AB (ref 3.5–5.1)
Sodium: 134 mmol/L — ABNORMAL LOW (ref 135–145)
Total Bilirubin: 7 mg/dL — ABNORMAL HIGH (ref 0.3–1.2)
Total Protein: 6.1 g/dL — ABNORMAL LOW (ref 6.5–8.1)

## 2018-09-15 LAB — PROTIME-INR
INR: 1.44
PROTHROMBIN TIME: 17.4 s — AB (ref 11.4–15.2)

## 2018-09-15 LAB — HEPATITIS PANEL, ACUTE
HEP A IGM: NEGATIVE
HEP B C IGM: NEGATIVE
HEP B S AG: NEGATIVE

## 2018-09-15 LAB — CBC
HEMATOCRIT: 45.4 % (ref 36.0–46.0)
HEMOGLOBIN: 15.1 g/dL — AB (ref 12.0–15.0)
MCH: 31.5 pg (ref 26.0–34.0)
MCHC: 33.3 g/dL (ref 30.0–36.0)
MCV: 94.8 fL (ref 78.0–100.0)
Platelets: 191 10*3/uL (ref 150–400)
RBC: 4.79 MIL/uL (ref 3.87–5.11)
RDW: 12.4 % (ref 11.5–15.5)
WBC: 14.6 10*3/uL — ABNORMAL HIGH (ref 4.0–10.5)

## 2018-09-15 LAB — LIPASE, BLOOD: LIPASE: 2620 U/L — AB (ref 11–51)

## 2018-09-15 LAB — GLUCOSE, CAPILLARY: Glucose-Capillary: 137 mg/dL — ABNORMAL HIGH (ref 70–99)

## 2018-09-15 MED ORDER — SODIUM CHLORIDE 0.9 % IV SOLN
INTRAVENOUS | Status: DC
  Administered 2018-09-15 (×2): via INTRAVENOUS

## 2018-09-15 MED ORDER — ROPINIROLE HCL 0.5 MG PO TABS
0.5000 mg | ORAL_TABLET | Freq: Two times a day (BID) | ORAL | Status: DC | PRN
  Administered 2018-09-15 – 2018-09-16 (×3): 0.5 mg via ORAL
  Filled 2018-09-15 (×3): qty 1

## 2018-09-15 MED ORDER — PIPERACILLIN-TAZOBACTAM 3.375 G IVPB 30 MIN
3.3750 g | Freq: Once | INTRAVENOUS | Status: AC
  Administered 2018-09-16: 3.375 g via INTRAVENOUS
  Filled 2018-09-15 (×2): qty 50

## 2018-09-15 MED ORDER — POTASSIUM CHLORIDE CRYS ER 20 MEQ PO TBCR
40.0000 meq | EXTENDED_RELEASE_TABLET | Freq: Once | ORAL | Status: AC
  Administered 2018-09-15: 40 meq via ORAL
  Filled 2018-09-15: qty 2

## 2018-09-15 NOTE — Progress Notes (Signed)
PROGRESS NOTE    CAMBRE MATSON  ZOX:[id number] DOB: 1961-01-02 DOA: 09/14/2018 PCP: Hurshel Party, NP      Brief Narrative:  Abigail Sexton is a 57 y.o. F with hypothyroidism who presents with intermittent post-prandial epigastric pain, finally developed severe constant epigastric pain and presented to [facility], also with elevated LFTs and dilated CBD on imaging.       Assessment & Plan:  Gallstone pancreatitis MRCP shows two 1 cm gallstones in CBD, pancreatitis.  Tachycardic this morning, fluids increased.  Bilirubin worsening, transaminases improved. - Liquid diet - Aggressive IV fluids - Hydromorphone IV every 4 hours for pain, versus oxycodone for pain, ondansetron for nausea -Consult to gastroenterology, ERCP planned for Friday   Transaminitis Transaminases significantly better today, although initial level seemed excessive for gallstone pancreatitis.  Acetaminophen level normal, no reported ingestion.  Acute hepatitis panel negative.  Possibly ischemic hepatitis. -Trend LFTs  Hypothyroidism -Continue Armour Thyroid  Other medications -Continue topiramate -Requip as needed for migraines       DVT prophylaxis: SCDs Code Status: FULL Family Communication: None present MDM and disposition Plan: The below labs and imaging reports were reviewed and summarized above.  Medication management as above.  The patient was admitted with gallstone pancreatitis, and severe elevation in transaminases.  She remains on IV fluids, which are intensified today and IV pain medicine.  ERCP planned for tomorrow.         Subjective: Epigastric pain worse today, malaise and hot flashes overnight, primarily due to pain.  No confusion, jaundice, vomiting.   Objective: Vitals:   09/15/18 0500 09/15/18 0521 09/15/18 1227 09/15/18 1408  BP:  118/86  113/80  Pulse:  (!) 133 94 98  Resp:  20  18  Temp:  99.5 F (37.5 C)  97.8 F (36.6 C)  TempSrc:  Oral  Oral  SpO2:  96%   99%  Weight: 91.9 kg     Height:        Intake/Output Summary (Last 24 hours) at 09/15/2018 1620 Last data filed at 09/15/2018 1545 Gross per 24 hour  Intake 3499.59 ml  Output 3100 ml  Net 399.59 ml   Filed Weights   09/14/18 0013 09/15/18 0500  Weight: 88.9 kg 91.9 kg    Examination:   General: Lying in bed, appears tired.  Responds appropriate to questions.  Distress from pain. HEENT: Conjunctivae lids and lashes normal without injection or discharge.  No nasal deformity or discharge.  Lips normal, teeth normal, oropharynx dry, no oral lesions, hearing normal. Cardiac: Tachycardic, regular, no murmurs, no lower extremity edema.   Respiratory: Normal respiratory rate and rhythm, lungs clear without rales or wheezes. Abdomen: Abdomen quite tender, mostly in the upper areas, no masses organomegaly. Extremities: No deformities/injuries.  5/5 grip strength and upper extremity flexion/extension, symmetrically.  Extremities are warm and well-perfused. Neuro: Sensorium intact and responding to questions, naming grossly intact, patient's recall, recent and remote, as well as general fund of knowledge seen within normal limits.  Moves all extremities with global weakness, symmetric coordination.  Speech fluent. Psych: Affect blunted, normal rate and rhythm speech.  Thought content appropriate.     Data Reviewed: I have personally reviewed following labs and imaging studies:  CBC: Recent Labs  Lab 09/14/18 0449 09/15/18 0605  WBC 11.0* 14.6*  NEUTROABS 8.8*  --   HGB 16.3* 15.1*  HCT 49.0* 45.4  MCV 96.1 94.8  PLT 268 191   Basic Metabolic Panel: Recent Labs  Lab 09/14/18  1610 09/15/18 0605  NA 140 134*  K 4.1 3.4*  CL 105 108  CO2 22 16*  GLUCOSE 118* 130*  BUN 8 7  CREATININE 1.02* 1.12*  CALCIUM 9.2 8.7*   GFR: Estimated Creatinine Clearance: 66.8 mL/min (A) (by C-G formula based on SCr of 1.12 mg/dL (H)). Liver Function Tests: Recent Labs  Lab 09/14/18 0449  09/15/18 0605  AST 890* 219*  ALT 1,000* 625*  ALKPHOS 273* 228*  BILITOT 3.0* 7.0*  PROT 6.7 6.1*  ALBUMIN 3.9 3.4*   Recent Labs  Lab 09/15/18 0605  LIPASE 2,620*   No results for input(s): AMMONIA in the last 168 hours. Coagulation Profile: Recent Labs  Lab 09/15/18 0605  INR 1.44   Cardiac Enzymes: No results for input(s): CKTOTAL, CKMB, CKMBINDEX, TROPONINI in the last 168 hours. BNP (last 3 results) No results for input(s): PROBNP in the last 8760 hours. HbA1C: No results for input(s): HGBA1C in the last 72 hours. CBG: Recent Labs  Lab 09/14/18 0752 09/15/18 0808  GLUCAP 121* 137*   Lipid Profile: No results for input(s): CHOL, HDL, LDLCALC, TRIG, CHOLHDL, LDLDIRECT in the last 72 hours. Thyroid Function Tests: No results for input(s): TSH, T4TOTAL, FREET4, T3FREE, THYROIDAB in the last 72 hours. Anemia Panel: No results for input(s): VITAMINB12, FOLATE, FERRITIN, TIBC, IRON, RETICCTPCT in the last 72 hours. Urine analysis: No results found for: COLORURINE, APPEARANCEUR, LABSPEC, PHURINE, GLUCOSEU, HGBUR, BILIRUBINUR, KETONESUR, PROTEINUR, UROBILINOGEN, NITRITE, LEUKOCYTESUR Sepsis Labs: @LABRCNTIP (procalcitonin:4,lacticacidven:4)  )No results found for this or any previous visit (from the past 240 hour(s)).       Radiology Studies: Mr 3d Recon At Scanner  Result Date: 09/14/2018 CLINICAL DATA:  Worsening epigastric pain and nausea with vomiting for 1 month. Biliary ductal dilatation on recent CT. Prior cholecystectomy. EXAM: MRI ABDOMEN WITHOUT AND WITH CONTRAST (INCLUDING MRCP) TECHNIQUE: Multiplanar multisequence MR imaging of the abdomen was performed both before and after the administration of intravenous contrast. Heavily T2-weighted images of the biliary and pancreatic ducts were obtained, and three-dimensional MRCP images were rendered by post processing. CONTRAST:  9 mL Gadavist COMPARISON:  CT on 09/13/2018 FINDINGS: Lower chest: No acute  findings. Hepatobiliary: No hepatic masses identified. Prior cholecystectomy. Diffuse biliary ductal dilatation is seen, with common bile duct measuring 14 mm. At least 2 calculi are seen in the distal common bile duct measuring approximately 1 cm. Pancreas: Mild diffuse pancreatic swelling and peripancreatic edema, consistent with acute pancreatitis. No evidence of pancreatic necrosis or pseudocyst. No evidence of pancreatic mass or pancreatic ductal dilatation. Spleen:  Within normal limits in size and appearance. Adrenals/Urinary Tract: No masses identified. No evidence of hydronephrosis. Stomach/Bowel: Visualized portion unremarkable. Vascular/Lymphatic: No pathologically enlarged lymph nodes identified. No abdominal aortic aneurysm. Other:  None. Musculoskeletal:  No suspicious bone lesions identified. IMPRESSION: Mild acute pancreatitis. No evidence of pancreatic necrosis or pseudocysts. Diffuse biliary ductal dilatation, with 2 calculi in the distal common bile duct measuring approximately 1 cm. Electronically Signed   By: Myles Rosenthal M.D.   On: 09/14/2018 10:56   Mr Abdomen Mrcp Vivien Rossetti Contast  Result Date: 09/14/2018 CLINICAL DATA:  Worsening epigastric pain and nausea with vomiting for 1 month. Biliary ductal dilatation on recent CT. Prior cholecystectomy. EXAM: MRI ABDOMEN WITHOUT AND WITH CONTRAST (INCLUDING MRCP) TECHNIQUE: Multiplanar multisequence MR imaging of the abdomen was performed both before and after the administration of intravenous contrast. Heavily T2-weighted images of the biliary and pancreatic ducts were obtained, and three-dimensional MRCP images were rendered by post  processing. CONTRAST:  9 mL Gadavist COMPARISON:  CT on 09/13/2018 FINDINGS: Lower chest: No acute findings. Hepatobiliary: No hepatic masses identified. Prior cholecystectomy. Diffuse biliary ductal dilatation is seen, with common bile duct measuring 14 mm. At least 2 calculi are seen in the distal common bile duct  measuring approximately 1 cm. Pancreas: Mild diffuse pancreatic swelling and peripancreatic edema, consistent with acute pancreatitis. No evidence of pancreatic necrosis or pseudocyst. No evidence of pancreatic mass or pancreatic ductal dilatation. Spleen:  Within normal limits in size and appearance. Adrenals/Urinary Tract: No masses identified. No evidence of hydronephrosis. Stomach/Bowel: Visualized portion unremarkable. Vascular/Lymphatic: No pathologically enlarged lymph nodes identified. No abdominal aortic aneurysm. Other:  None. Musculoskeletal:  No suspicious bone lesions identified. IMPRESSION: Mild acute pancreatitis. No evidence of pancreatic necrosis or pseudocysts. Diffuse biliary ductal dilatation, with 2 calculi in the distal common bile duct measuring approximately 1 cm. Electronically Signed   By: Myles Rosenthal M.D.   On: 09/14/2018 10:56        Scheduled Meds: . thyroid  90 mg Oral QAC breakfast  . topiramate  50 mg Oral BID   Continuous Infusions: . sodium chloride 200 mL/hr at 09/15/18 1231  . [START ON 09/16/2018] piperacillin-tazobactam       LOS: 1 day    Time spent: 25 minutes    Alberteen Sam, MD [facility] 09/15/2018, 4:20 PM     Pager [phone] --- please page though AMION:  [email or web address] Password [email or web address] If 7PM-7AM, please contact night-coverage

## 2018-09-15 NOTE — Progress Notes (Signed)
[facility] Progress Note  Abigail Sexton 57 y.o. [birth year]  CC: Pancreatitis, choledocholithiasis   Subjective: Abdominal pain has improved.  Denies nausea vomiting.  Denies any bleeding episodes.  ROS : Negative for chest pain and shortness of breath.  Negative for cough.    Objective: Vital signs in last 24 hours: Vitals:   09/14/18 2136 09/15/18 0521  BP: (!) 141/92 118/86  Pulse: (!) 105 (!) 133  Resp: 16 20  Temp: 98.6 F (37 C) 99.5 F (37.5 C)  SpO2: 100% 96%    Physical Exam:  General:  Alert, cooperative, no distress, appears stated age  Head:  Normocephalic, without obvious abnormality, atraumatic  Eyes:   Scleral icterus noted  Lungs:   Clear to auscultation bilaterally, respirations unlabored  Heart:  Regular rate and rhythm, S1, S2 normal  Abdomen:   Soft, epigastric discomfort on palpation, bowel sounds present, no peritoneal signs  Extremities: Extremities normal, atraumatic, no  edema  Pulses: 2+ and symmetric    Lab Results: Recent Labs    09/14/18 0449 09/15/18 0605  NA 140 134*  K 4.1 3.4*  CL 105 108  CO2 22 16*  GLUCOSE 118* 130*  BUN 8 7  CREATININE 1.02* 1.12*  CALCIUM 9.2 8.7*   Recent Labs    09/14/18 0449 09/15/18 0605  AST 890* 219*  ALT 1,000* 625*  ALKPHOS 273* 228*  BILITOT 3.0* 7.0*  PROT 6.7 6.1*  ALBUMIN 3.9 3.4*   Recent Labs    09/14/18 0449 09/15/18 0605  WBC 11.0* 14.6*  NEUTROABS 8.8*  --   HGB 16.3* 15.1*  HCT 49.0* 45.4  MCV 96.1 94.8  PLT 268 191   Recent Labs    09/15/18 0605  LABPROT 17.4*  INR 1.44      Assessment/Plan: -Choledocholithiasis -2 approximately 1 cm stones in the distal common bile duct per MRI report. -Acute pancreatitis -Abnormal LFTs, AST 890, ALT 1000, Aphos 273 and T bili 3 -transaminases improving but with total bilirubin trending up. ??  May had an episode of hypotension resulting in ischemic hepatitis   Recommendations ------------------------- -ERCP  tomorrow at 8 AM.  N.p.o. past midnight.  Preop Zosyn.  She is allergic to ciprofloxacin. -Normal saline at 200 cc/h for pancreatitis. -Hepatitis panel negative.  Risks (bleeding, infection, bowel perforation that could require surgery, sedation-related changes in cardiopulmonary systems), benefits (identification and possible treatment of source of symptoms, exclusion of certain causes of symptoms), and alternatives (watchful waiting, radiographic imaging studies, empiric medical treatment)  were explained to patient/family in detail and patient wishes to proceed.   Kathi Der MD, FACP 09/15/2018, 11:36 AM  Contact #  [phone]

## 2018-09-15 NOTE — Plan of Care (Signed)
  Problem: Education: Goal: Knowledge of General Education information will improve Description Including pain rating scale, medication(s)/side effects and non-pharmacologic comfort measures Outcome: Progressing   Problem: Health Behavior/Discharge Planning: Goal: Ability to manage health-related needs will improve Outcome: Progressing   Problem: Clinical Measurements: Goal: Ability to maintain clinical measurements within normal limits will improve Outcome: Progressing Goal: Will remain free from infection Outcome: Progressing Goal: Diagnostic test results will improve Outcome: Progressing Goal: Respiratory complications will improve Outcome: Progressing Goal: Cardiovascular complication will be avoided Outcome: Progressing   Problem: Activity: Goal: Risk for activity intolerance will decrease Outcome: Progressing   Problem: Nutrition: Goal: Adequate nutrition will be maintained Outcome: Progressing   Problem: Coping: Goal: Level of anxiety will decrease Outcome: Progressing   Problem: Elimination: Goal: Will not experience complications related to bowel motility Outcome: Progressing Goal: Will not experience complications related to urinary retention Outcome: Progressing   Problem: Pain Managment: Goal: General experience of comfort will improve Outcome: Progressing   Problem: Safety: Goal: Ability to remain free from injury will improve Outcome: Progressing   Problem: Skin Integrity: Goal: Risk for impaired skin integrity will decrease Outcome: Progressing   Sonny Masters, RN 09/15/2018 12:05 AM

## 2018-09-15 NOTE — H&P (View-Only) (Signed)
[facility] Progress Note  Abigail Sexton 57 y.o. 04/09/1961  CC: Pancreatitis, choledocholithiasis   Subjective: Abdominal pain has improved.  Denies nausea vomiting.  Denies any bleeding episodes.  ROS : Negative for chest pain and shortness of breath.  Negative for cough.    Objective: Vital signs in last 24 hours: Vitals:   09/14/18 2136 09/15/18 0521  BP: (!) 141/92 118/86  Pulse: (!) 105 (!) 133  Resp: 16 20  Temp: 98.6 F (37 C) 99.5 F (37.5 C)  SpO2: 100% 96%    Physical Exam:  General:  Alert, cooperative, no distress, appears stated age  Head:  Normocephalic, without obvious abnormality, atraumatic  Eyes:   Scleral icterus noted  Lungs:   Clear to auscultation bilaterally, respirations unlabored  Heart:  Regular rate and rhythm, S1, S2 normal  Abdomen:   Soft, epigastric discomfort on palpation, bowel sounds present, no peritoneal signs  Extremities: Extremities normal, atraumatic, no  edema  Pulses: 2+ and symmetric    Lab Results: Recent Labs    09/14/18 0449 09/15/18 0605  NA 140 134*  K 4.1 3.4*  CL 105 108  CO2 22 16*  GLUCOSE 118* 130*  BUN 8 7  CREATININE 1.02* 1.12*  CALCIUM 9.2 8.7*   Recent Labs    09/14/18 0449 09/15/18 0605  AST 890* 219*  ALT 1,000* 625*  ALKPHOS 273* 228*  BILITOT 3.0* 7.0*  PROT 6.7 6.1*  ALBUMIN 3.9 3.4*   Recent Labs    09/14/18 0449 09/15/18 0605  WBC 11.0* 14.6*  NEUTROABS 8.8*  --   HGB 16.3* 15.1*  HCT 49.0* 45.4  MCV 96.1 94.8  PLT 268 191   Recent Labs    09/15/18 0605  LABPROT 17.4*  INR 1.44      Assessment/Plan: -Choledocholithiasis -2 approximately 1 cm stones in the distal common bile duct per MRI report. -Acute pancreatitis -Abnormal LFTs, AST 890, ALT 1000, Aphos 273 and T bili 3 -transaminases improving but with total bilirubin trending up. ??  May had an episode of hypotension resulting in ischemic hepatitis   Recommendations ------------------------- -ERCP  tomorrow at 8 AM.  N.p.o. past midnight.  Preop Zosyn.  She is allergic to ciprofloxacin. -Normal saline at 200 cc/h for pancreatitis. -Hepatitis panel negative.  Risks (bleeding, infection, bowel perforation that could require surgery, sedation-related changes in cardiopulmonary systems), benefits (identification and possible treatment of source of symptoms, exclusion of certain causes of symptoms), and alternatives (watchful waiting, radiographic imaging studies, empiric medical treatment)  were explained to patient/family in detail and patient wishes to proceed.   Sibbie Flammia MD, FACP 09/15/2018, 11:36 AM  Contact #  [phone] 

## 2018-09-16 ENCOUNTER — Encounter (HOSPITAL_COMMUNITY)
Admission: EM | Disposition: A | Discharge status: Home / Self Care | Payer: Self-pay | Source: Other Acute Inpatient Hospital | Attending: Family Medicine

## 2018-09-16 ENCOUNTER — Encounter (HOSPITAL_COMMUNITY): Payer: Self-pay

## 2018-09-16 ENCOUNTER — Inpatient Hospital Stay (HOSPITAL_COMMUNITY): Payer: BLUE CROSS/BLUE SHIELD | Admitting: Certified Registered Nurse Anesthetist

## 2018-09-16 ENCOUNTER — Inpatient Hospital Stay (HOSPITAL_COMMUNITY): Payer: BLUE CROSS/BLUE SHIELD

## 2018-09-16 DIAGNOSIS — Z9889 Other specified postprocedural states: Secondary | ICD-10-CM | POA: Diagnosis not present

## 2018-09-16 DIAGNOSIS — K851 Biliary acute pancreatitis without necrosis or infection: Secondary | ICD-10-CM | POA: Diagnosis not present

## 2018-09-16 DIAGNOSIS — K8050 Calculus of bile duct without cholangitis or cholecystitis without obstruction: Secondary | ICD-10-CM | POA: Diagnosis not present

## 2018-09-16 DIAGNOSIS — Z9049 Acquired absence of other specified parts of digestive tract: Secondary | ICD-10-CM | POA: Diagnosis not present

## 2018-09-16 DIAGNOSIS — K859 Acute pancreatitis without necrosis or infection, unspecified: Secondary | ICD-10-CM

## 2018-09-16 DIAGNOSIS — K831 Obstruction of bile duct: Secondary | ICD-10-CM | POA: Diagnosis not present

## 2018-09-16 DIAGNOSIS — G43709 Chronic migraine without aura, not intractable, without status migrainosus: Secondary | ICD-10-CM | POA: Diagnosis not present

## 2018-09-16 DIAGNOSIS — G43909 Migraine, unspecified, not intractable, without status migrainosus: Secondary | ICD-10-CM | POA: Diagnosis not present

## 2018-09-16 DIAGNOSIS — N183 Chronic kidney disease, stage 3 (moderate): Secondary | ICD-10-CM | POA: Diagnosis not present

## 2018-09-16 DIAGNOSIS — K838 Other specified diseases of biliary tract: Secondary | ICD-10-CM | POA: Diagnosis not present

## 2018-09-16 DIAGNOSIS — E039 Hypothyroidism, unspecified: Secondary | ICD-10-CM | POA: Diagnosis not present

## 2018-09-16 DIAGNOSIS — R1013 Epigastric pain: Secondary | ICD-10-CM | POA: Diagnosis not present

## 2018-09-16 DIAGNOSIS — R109 Unspecified abdominal pain: Secondary | ICD-10-CM | POA: Diagnosis not present

## 2018-09-16 DIAGNOSIS — R7989 Other specified abnormal findings of blood chemistry: Secondary | ICD-10-CM | POA: Diagnosis not present

## 2018-09-16 HISTORY — PX: REMOVAL OF STONES: SHX5545

## 2018-09-16 HISTORY — PX: SPHINCTEROTOMY: SHX5544

## 2018-09-16 HISTORY — PX: ERCP: SHX5425

## 2018-09-16 LAB — COMPREHENSIVE METABOLIC PANEL
ALBUMIN: 2.9 g/dL — AB (ref 3.5–5.0)
ALT: 336 U/L — ABNORMAL HIGH (ref 0–44)
AST: 71 U/L — AB (ref 15–41)
Alkaline Phosphatase: 160 U/L — ABNORMAL HIGH (ref 38–126)
Anion gap: 6 (ref 5–15)
BUN: 5 mg/dL — ABNORMAL LOW (ref 6–20)
CO2: 19 mmol/L — ABNORMAL LOW (ref 22–32)
CREATININE: 0.9 mg/dL (ref 0.44–1.00)
Calcium: 8.9 mg/dL (ref 8.9–10.3)
Chloride: 113 mmol/L — ABNORMAL HIGH (ref 98–111)
GFR calc non Af Amer: >60 mL/min (ref >60)
Glucose, Bld: 91 mg/dL (ref 70–99)
Potassium: 4 mmol/L (ref 3.5–5.1)
SODIUM: 138 mmol/L (ref 135–145)
Total Bilirubin: 4.2 mg/dL — ABNORMAL HIGH (ref 0.3–1.2)
Total Protein: 5.6 g/dL — ABNORMAL LOW (ref 6.5–8.1)

## 2018-09-16 LAB — LIPASE, BLOOD: Lipase: 81 U/L — ABNORMAL HIGH (ref 11–51)

## 2018-09-16 LAB — GLUCOSE, CAPILLARY: GLUCOSE-CAPILLARY: 95 mg/dL (ref 70–99)

## 2018-09-16 SURGERY — ERCP, WITH INTERVENTION IF INDICATED
Anesthesia: General

## 2018-09-16 MED ORDER — SODIUM CHLORIDE 0.9 % IV SOLN
INTRAVENOUS | Status: DC | PRN
  Administered 2018-09-16: 30 mL

## 2018-09-16 MED ORDER — IOPAMIDOL (ISOVUE-300) INJECTION 61%
INTRAVENOUS | Status: AC
  Filled 2018-09-16: qty 100

## 2018-09-16 MED ORDER — LIDOCAINE HCL (CARDIAC) PF 100 MG/5ML IV SOSY
PREFILLED_SYRINGE | INTRAVENOUS | Status: DC | PRN
  Administered 2018-09-16: 60 mg via INTRAVENOUS

## 2018-09-16 MED ORDER — INDOMETHACIN 50 MG RE SUPP
RECTAL | Status: AC
  Filled 2018-09-16: qty 2

## 2018-09-16 MED ORDER — SODIUM CHLORIDE 0.9 % IV SOLN: INTRAVENOUS | Status: AC

## 2018-09-16 MED ORDER — GLUCAGON HCL RDNA (DIAGNOSTIC) 1 MG IJ SOLR
INTRAMUSCULAR | Status: AC
  Filled 2018-09-16: qty 1

## 2018-09-16 MED ORDER — SUGAMMADEX SODIUM 200 MG/2ML IV SOLN
INTRAVENOUS | Status: DC | PRN
  Administered 2018-09-16: 367.6 mg via INTRAVENOUS

## 2018-09-16 MED ORDER — LACTATED RINGERS IV SOLN
INTRAVENOUS | Status: DC
  Administered 2018-09-16: 07:00:00 via INTRAVENOUS

## 2018-09-16 MED ORDER — PANTOPRAZOLE SODIUM 40 MG PO TBEC
40.0000 mg | DELAYED_RELEASE_TABLET | Freq: Every day | ORAL | Status: DC
  Administered 2018-09-16 – 2018-09-17 (×2): 40 mg via ORAL
  Filled 2018-09-16 (×2): qty 1

## 2018-09-16 MED ORDER — PROPOFOL 10 MG/ML IV BOLUS
INTRAVENOUS | Status: DC | PRN
  Administered 2018-09-16: 150 mg via INTRAVENOUS

## 2018-09-16 MED ORDER — SODIUM CHLORIDE 0.9 % IV SOLN: INTRAVENOUS | Status: DC

## 2018-09-16 MED ORDER — ONDANSETRON HCL 4 MG/2ML IJ SOLN
INTRAMUSCULAR | Status: DC | PRN
  Administered 2018-09-16: 4 mg via INTRAVENOUS

## 2018-09-16 MED ORDER — ESTRADIOL-NORETHINDRONE ACET 1-0.5 MG PO TABS: 1.0000 | ORAL_TABLET | Freq: Every day | ORAL | Status: DC

## 2018-09-16 MED ORDER — TOPIRAMATE 25 MG PO CPSP: 37.5000 mg | ORAL_CAPSULE | Freq: Every day | ORAL | Status: DC

## 2018-09-16 MED ORDER — ROCURONIUM BROMIDE 100 MG/10ML IV SOLN
INTRAVENOUS | Status: DC | PRN
  Administered 2018-09-16: 50 mg via INTRAVENOUS

## 2018-09-16 MED ORDER — THYROID 60 MG PO TABS: 90.0000 mg | ORAL_TABLET | Freq: Every day | ORAL | Status: DC

## 2018-09-16 MED ORDER — FENTANYL CITRATE (PF) 250 MCG/5ML IJ SOLN
INTRAMUSCULAR | Status: DC | PRN
  Administered 2018-09-16 (×2): 50 ug via INTRAVENOUS

## 2018-09-16 MED ORDER — MONTELUKAST SODIUM 10 MG PO TABS
10.0000 mg | ORAL_TABLET | Freq: Every day | ORAL | Status: DC
  Administered 2018-09-16 – 2018-09-17 (×2): 10 mg via ORAL
  Filled 2018-09-16 (×2): qty 1

## 2018-09-16 MED ORDER — INDOMETHACIN 50 MG RE SUPP
RECTAL | Status: DC | PRN
  Administered 2018-09-16: 100 mg via RECTAL

## 2018-09-16 MED ORDER — MILNACIPRAN HCL 50 MG PO TABS
75.0000 mg | ORAL_TABLET | Freq: Two times a day (BID) | ORAL | Status: DC
  Administered 2018-09-16 – 2018-09-17 (×2): 75 mg via ORAL
  Filled 2018-09-16 (×3): qty 1.5

## 2018-09-16 NOTE — Op Note (Signed)
[facility] Patient Name: Abigail Sexton Procedure Date : 09/16/2018 MRN: [id number] Attending MD: Kerin Salen , MD Date of Birth: 1961/06/13 CSN: [id number] Age: 57 Admit Type: Inpatient Procedure:                ERCP Indications:              Common bile duct stone(s), Acute pancreatitis Providers:                Kerin Salen, MD, Dwain Sarna, RN, Verita Schneiders,                            Technician, Virgel Gess, CRNA Referring MD:              Medicines:                Monitored Anesthesia Care Complications:            No immediate complications. Estimated blood loss:                            Minimal. Estimated Blood Loss:     Estimated blood loss was minimal. Procedure:                Pre-Anesthesia Assessment:                           - Prior to the procedure, a History and Physical                            was performed, and patient medications and                            allergies were reviewed. The patient's tolerance of                            previous anesthesia was also reviewed. The risks                            and benefits of the procedure and the sedation                            options and risks were discussed with the patient.                            All questions were answered, and informed consent                            was obtained. Prior Anticoagulants: The patient has                            taken no previous anticoagulant or antiplatelet                            agents. ASA Grade Assessment: II - A patient with  mild systemic disease. After reviewing the risks                            and benefits, the patient was deemed in                            satisfactory condition to undergo the procedure.                           After obtaining informed consent, the scope was                            passed under direct vision. Throughout the                            procedure, the patient's  blood pressure, pulse, and                            oxygen saturations were monitored continuously. The                            [id number]-Q180V ([id number]) Olympus ERCP was introduced                            through the mouth, and used to inject contrast into                            and used to inject contrast into the bile duct. The                            ERCP was accomplished without difficulty. The                            patient tolerated the procedure well. Scope In: Scope Out: Findings:      The scout film was normal. The esophagus was successfully intubated       under direct vision. The scope was advanced to a normal major papilla in       the descending duodenum without detailed examination of the pharynx,       larynx and associated structures, and upper GI tract. The upper GI tract       was grossly normal. The bile duct was deeply cannulated with the       sphincterotome. Contrast was injected. I personally interpreted the bile       duct images. There was brisk flow of contrast through the ducts. Image       quality was excellent. Contrast extended to the entire biliary tree. The       lower third of the main bile duct contained two stones, the largest of       which was 10 mm in diameter. The main bile duct was mildly dilated. The       largest diameter was 12 mm. A cholecystectomy had been performed. A       straight Roadrunner wire was passed into the biliary tree. A 12 mm       biliary sphincterotomy  was made with a braided sphincterotome using ERBE       electrocautery. There was no post-sphincterotomy bleeding. The biliary       tree was swept with a 12 mm and then with a 15 mm balloon starting at       the bifurcation. Small amount of sludge was swept from the duct. Two       stones were removed (one stone was about 1 cm in size, the other stone       appeared fragmented). No stones remained. The pancreatic duct was not       cannulated or injected during the  procedure. Impression:               - The entire main bile duct was mildly dilated.                           - The patient has had a cholecystectomy.                           - Choledocholithiasis was found. Complete removal                            was accomplished by biliary sphincterotomy and                            balloon extraction.                           - A biliary sphincterotomy was performed.                           - The biliary tree was swept. Moderate Sedation:      Patient did not receive moderate sedation for this procedure, but       instead received monitored anesthesia care. Recommendation:           - Low fat diet.                           - Check liver enzymes (AST, ALT, alkaline                            phosphatase, bilirubin) tomorrow. Procedure Code(s):        --- Professional ---                           [phone], Endoscopic retrograde                            cholangiopancreatography (ERCP); with removal of                            calculi/debris from biliary/pancreatic duct(s)                           43262, Endoscopic retrograde                            cholangiopancreatography (ERCP); with  sphincterotomy/papillotomy                           [id number], Endoscopic catheterization of the biliary                            ductal system, radiological supervision and                            interpretation Diagnosis Code(s):        --- Professional ---                           Z90.49, Acquired absence of other specified parts                            of digestive tract                           K80.50, Calculus of bile duct without cholangitis                            or cholecystitis without obstruction                           K85.90, Acute pancreatitis without necrosis or                            infection, unspecified                           K83.8, Other specified diseases of biliary tract CPT  copyright 2017 American Medical Association. All rights reserved. The codes documented in this report are preliminary and upon coder review may  be revised to meet current compliance requirements. Kerin Salen, MD 09/16/2018 8:39:39 AM This report has been signed electronically. Number of Addenda: 0

## 2018-09-16 NOTE — Anesthesia Postprocedure Evaluation (Signed)
Anesthesia Post Note  Patient: Abigail Sexton  Procedure(s) Performed: ENDOSCOPIC RETROGRADE CHOLANGIOPANCREATOGRAPHY (ERCP) (N/A ) SPHINCTEROTOMY REMOVAL OF STONES     Patient location during evaluation: PACU Anesthesia Type: General Level of consciousness: awake and alert Pain management: pain level controlled Vital Signs Assessment: post-procedure vital signs reviewed and stable Respiratory status: spontaneous breathing, nonlabored ventilation, respiratory function stable and patient connected to nasal cannula oxygen Cardiovascular status: blood pressure returned to baseline and stable Postop Assessment: no apparent nausea or vomiting Anesthetic complications: no    Last Vitals:  Vitals:   09/16/18 0900 09/16/18 0905  BP: 130/81 127/82  Pulse: 89 90  Resp: 18 17  Temp:    SpO2: 99% 99%    Last Pain:  Vitals:   09/16/18 0905  TempSrc:   PainSc: 0-No pain                 Ronda Rajkumar S

## 2018-09-16 NOTE — Brief Op Note (Signed)
09/14/2018 - 09/16/2018  8:40 AM  PATIENT:  Abigail Sexton  57 y.o. female  PRE-OPERATIVE DIAGNOSIS:  CBD stone  POST-OPERATIVE DIAGNOSIS:  sphinterotomy,cbd stone extraction  PROCEDURE:  Procedure(s): ENDOSCOPIC RETROGRADE CHOLANGIOPANCREATOGRAPHY (ERCP) (N/A) SPHINCTEROTOMY REMOVAL OF STONES  SURGEON:  Surgeon(s) and Role:    Ronnette Juniper, MD - Primary  PHYSICIAN ASSISTANT:   ASSISTANTS: Cleda Daub, RN, Charolette Child, Tech  ANESTHESIA:   MAC  EBL:  Minimal   BLOOD ADMINISTERED:none  DRAINS: none   LOCAL MEDICATIONS USED:  NONE  SPECIMEN:  No Specimen  DISPOSITION OF SPECIMEN:  N/A  COUNTS:  YES  TOURNIQUET:  * No tourniquets in log *  DICTATION: .Dragon Dictation  PLAN OF CARE: Admit to inpatient   PATIENT DISPOSITION:  PACU - hemodynamically stable.   Delay start of Pharmacological VTE agent (>24hrs) due to surgical blood loss or risk of bleeding: no

## 2018-09-16 NOTE — Op Note (Signed)
ERCP was performed for CBD stones noted on MRCP and acute pancreatitis.  Findings: Mildly dilated CBD of 12 mm. Surgical clips noted compatible with prior cholecystectomy. 2 round filling defects noted in the distal common bile duct. After adequate sphincterotomy balloon sweep was performed with 12 mm and 15 mm balloon starting at the bifurcation. One large stone about 1 cm in size and another stone which appeared fragmented along with sludge was removed subsequently from common bile duct. No stones remained in the duct at the end of procedure on cholangiogram. The pancreatic duct was never injected or cannulated during the procedure.   Recommendations: Low-fat diet. Liver enzymes in a.m. If patient's pain free okay to discharge from GI standpoint tomorrow morning.  Kerin Salen, M.D.

## 2018-09-16 NOTE — Transfer of Care (Signed)
Immediate Anesthesia Transfer of Care Note  Patient: Abigail Sexton  Procedure(s) Performed: ENDOSCOPIC RETROGRADE CHOLANGIOPANCREATOGRAPHY (ERCP) (N/A ) SPHINCTEROTOMY REMOVAL OF STONES  Patient Location: Endoscopy Unit  Anesthesia Type:General  Level of Consciousness: awake, alert , oriented, patient cooperative and responds to stimulation  Airway & Oxygen Therapy: Patient Spontanous Breathing  Post-op Assessment: Report given to RN, Post -op Vital signs reviewed and stable and Patient moving all extremities X 4  Post vital signs: Reviewed and stable  Last Vitals:  Vitals Value Taken Time  BP    Temp    Pulse    Resp    SpO2      Last Pain:  Vitals:   09/16/18 0717  TempSrc: Oral  PainSc: 4       Patients Stated Pain Goal: 3 (09/15/18 1610)  Complications: No apparent anesthesia complications

## 2018-09-16 NOTE — Anesthesia Procedure Notes (Signed)
Procedure Name: Intubation Date/Time: 09/16/2018 8:03 AM Performed by: Glynda Jaeger, CRNA Pre-anesthesia Checklist: Patient identified, Patient being monitored, Timeout performed, Emergency Drugs available and Suction available Patient Re-evaluated:Patient Re-evaluated prior to induction Oxygen Delivery Method: Circle System Utilized Preoxygenation: Pre-oxygenation with 100% oxygen Induction Type: IV induction Ventilation: Mask ventilation without difficulty Laryngoscope Size: Mac and 3 Grade View: Grade II Tube type: Oral Tube size: 7.0 mm Number of attempts: 1 Airway Equipment and Method: Stylet Placement Confirmation: ETT inserted through vocal cords under direct vision,  positive ETCO2 and breath sounds checked- equal and bilateral Secured at: 22 cm Tube secured with: Tape Dental Injury: Teeth and Oropharynx as per pre-operative assessment

## 2018-09-16 NOTE — Anesthesia Preprocedure Evaluation (Signed)
Anesthesia Evaluation  Patient identified by MRN, date of birth, ID band Patient awake  General Assessment Comment:Acute cholecystitis  Reviewed: Allergy & Precautions, NPO status , Patient's Chart, lab work & pertinent test results  Airway Mallampati: II  TM Distance: >3 FB Neck ROM: Full    Dental no notable dental hx.    Pulmonary neg pulmonary ROS,    Pulmonary exam normal breath sounds clear to auscultation       Cardiovascular negative cardio ROS Normal cardiovascular exam Rhythm:Regular Rate:Normal     Neuro/Psych negative neurological ROS  negative psych ROS   GI/Hepatic negative GI ROS, Neg liver ROS,   Endo/Other  Hypothyroidism   Renal/GU Renal InsufficiencyRenal disease  negative genitourinary   Musculoskeletal negative musculoskeletal ROS (+)   Abdominal   Peds negative pediatric ROS (+)  Hematology negative hematology ROS (+)   Anesthesia Other Findings   Reproductive/Obstetrics negative OB ROS                             Anesthesia Physical Anesthesia Plan  ASA: II  Anesthesia Plan: General   Post-op Pain Management:    Induction: Intravenous  PONV Risk Score and Plan: 3 and Ondansetron, Dexamethasone, Treatment may vary due to age or medical condition and Midazolam  Airway Management Planned: Oral ETT  Additional Equipment:   Intra-op Plan:   Post-operative Plan: Extubation in OR  Informed Consent: I have reviewed the patients History and Physical, chart, labs and discussed the procedure including the risks, benefits and alternatives for the proposed anesthesia with the patient or authorized representative who has indicated his/her understanding and acceptance.   Dental advisory given  Plan Discussed with: CRNA and Surgeon  Anesthesia Plan Comments:         Anesthesia Quick Evaluation

## 2018-09-16 NOTE — Progress Notes (Signed)
PROGRESS NOTE    KINA SHIFFMAN  UJW:[id number] DOB: [birth year] DOA: 09/14/2018 PCP: Hurshel Party, NP      Brief Narrative:  Mrs. Kowal is a 57 y.o. F with hypothyroidism who presents with intermittent post-prandial epigastric pain, finally developed severe constant epigastric pain and presented to [facility], also with elevated LFTs and dilated CBD on imaging.       Assessment & Plan:  Gallstone pancreatitis MRCP showed two 1 cm gallstones in CBD, pancreatitis.  Tbili, lipase and AST/ ALT down. Went for ERCP today 9/27 > 2 stones removed from common bile duct and sludge also removed, +sphincterotomy. Pt did well, post procedure is stable w/ mild pain.  - Per GI if stable can be dc'd tomorrow am - low fat diet - prn pain meds - appreciate gastroenterology   Transaminitis Transaminases significantly better today, although initial level seemed excessive for gallstone pancreatitis.  Acetaminophen level normal, no reported ingestion.  Acute hepatitis negative - LFT's / lipase all improving  Hypothyroidism -Continue Armour Thyroid  Other medications -Continue topiramate -Requip as needed for migraines       DVT prophylaxis: SCDs Code Status: FULL Family Communication: None present Disposition: for dc tomorrow am if remains stable    Subjective: Had ERCP this am, see results above.  No new c/o's, taking tylenol now for mild abd pain.     Objective: Vitals:   09/16/18 0850 09/16/18 0900 09/16/18 0905 09/16/18 1001  BP: 137/84 130/81 127/82 (!) 134/93  Pulse: 95 89 90 74  Resp: 15 18 17 16   Temp:    98.1 F (36.7 C)  TempSrc:    Oral  SpO2: 100% 99% 99% 100%  Weight:      Height:        Intake/Output Summary (Last 24 hours) at 09/16/2018 1141 Last data filed at 09/16/2018 0845 Gross per 24 hour  Intake 4975.26 ml  Output 2500 ml  Net 2475.26 ml   Filed Weights   09/14/18 0013 09/15/18 0500  Weight: 88.9 kg 91.9 kg    Examination:   General:  Lying in bed Responds appropriate to questions.  Distress from pain. Cardiac: Tachycardic, regular, no murmurs, no lower extremity edema.   Respiratory: Normal respiratory rate and rhythm, lungs clear without rales Abdomen: Abdomen quite tender, mostly in the upper areas, no masses Extremities: No deformities/injuries.  5/5 grip strength and upper extremity flexion/extension, symmetrically.  Extremities are warm and well-perfused.     Data Reviewed: I have personally reviewed following labs and imaging studies:  CBC: Recent Labs  Lab 09/14/18 0449 09/15/18 0605  WBC 11.0* 14.6*  NEUTROABS 8.8*  --   HGB 16.3* 15.1*  HCT 49.0* 45.4  MCV 96.1 94.8  PLT 268 191   Basic Metabolic Panel: Recent Labs  Lab 09/14/18 0449 09/15/18 0605 09/16/18 0617  NA 140 134* 138  K 4.1 3.4* 4.0  CL 105 108 113*  CO2 22 16* 19*  GLUCOSE 118* 130* 91  BUN 8 7 5*  CREATININE 1.02* 1.12* 0.90  CALCIUM 9.2 8.7* 8.9   GFR: Estimated Creatinine Clearance: 83.1 mL/min (by C-G formula based on SCr of 0.9 mg/dL). Liver Function Tests: Recent Labs  Lab 09/14/18 0449 09/15/18 0605 09/16/18 0617  AST 890* 219* 71*  ALT 1,000* 625* 336*  ALKPHOS 273* 228* 160*  BILITOT 3.0* 7.0* 4.2*  PROT 6.7 6.1* 5.6*  ALBUMIN 3.9 3.4* 2.9*   Recent Labs  Lab 09/15/18 0605 09/16/18 0617  LIPASE 2,620* 81*  No results for input(s): AMMONIA in the last 168 hours. Coagulation Profile: Recent Labs  Lab 09/15/18 0605  INR 1.44   Cardiac Enzymes: No results for input(s): CKTOTAL, CKMB, CKMBINDEX, TROPONINI in the last 168 hours. BNP (last 3 results) No results for input(s): PROBNP in the last 8760 hours. HbA1C: No results for input(s): HGBA1C in the last 72 hours. CBG: Recent Labs  Lab 09/14/18 0752 09/15/18 0808 09/16/18 1009  GLUCAP 121* 137* 95   Lipid Profile: No results for input(s): CHOL, HDL, LDLCALC, TRIG, CHOLHDL, LDLDIRECT in the last 72 hours. Thyroid Function Tests: No results  for input(s): TSH, T4TOTAL, FREET4, T3FREE, THYROIDAB in the last 72 hours. Anemia Panel: No results for input(s): VITAMINB12, FOLATE, FERRITIN, TIBC, IRON, RETICCTPCT in the last 72 hours. Urine analysis: No results found for: COLORURINE, APPEARANCEUR, LABSPEC, PHURINE, GLUCOSEU, HGBUR, BILIRUBINUR, KETONESUR, PROTEINUR, UROBILINOGEN, NITRITE, LEUKOCYTESUR Sepsis Labs: @LABRCNTIP (procalcitonin:4,lacticacidven:4)  )No results found for this or any previous visit (from the past 240 hour(s)).       Radiology Studies: Dg Ercp Biliary & Pancreatic Ducts  Result Date: 09/16/2018 CLINICAL DATA:  Choledocholithiasis and acute pancreatitis. EXAM: ERCP TECHNIQUE: Multiple spot images obtained with the fluoroscopic device and submitted for interpretation post-procedure. COMPARISON:  MRCP on 09/14/2018 FINDINGS: Images obtained with a C-arm during the procedure demonstrate cannulation and injection of the common bile duct demonstrating mild diffuse dilatation of the CBD with discrete filling defects identified consistent with calculi. There appears to be 2 discrete filling defects. Balloon sweep maneuver was performed to remove calculi with completion cholangiogram showing normal patency of the common bile duct without further filling defects. IMPRESSION: Choledocholithiasis with at least 2 discrete filling defects identified in the common bile duct. Balloon sweep maneuver was performed to extract calculi. These images were submitted for radiologic interpretation only. Please see the procedural report for the amount of contrast and the fluoroscopy time utilized. Electronically Signed   By: Irish Lack M.D.   On: 09/16/2018 09:26        Scheduled Meds: . estradiol-norethindrone  1 tablet Oral Daily  . Milnacipran  75 mg Oral BID  . montelukast  10 mg Oral Daily  . pantoprazole  40 mg Oral Daily  . thyroid  90 mg Oral QAC breakfast  . topiramate  50 mg Oral BID   Continuous Infusions: .  sodium chloride 125 mL/hr at 09/16/18 1001     LOS: 2 days

## 2018-09-16 NOTE — Interval H&P Note (Signed)
History and Physical Interval Note: 56/female with choledocholithiasis(two 1 cm distal CBD stones on MRCP) and pancreatitis for an ERCP.  09/16/2018 7:51 AM  Abigail Sexton  has presented today for ERCP with the diagnosis of CBD stone  The various methods of treatment have been discussed with the patient and family. After consideration of risks, benefits and other options for treatment, the patient has consented to  Procedure(s): ENDOSCOPIC RETROGRADE CHOLANGIOPANCREATOGRAPHY (ERCP) (N/A) as a surgical intervention .  The patient's history has been reviewed, patient examined, no change in status, stable for surgery.  I have reviewed the patient's chart and labs.  Questions were answered to the patient's satisfaction.     Kerin Salen

## 2018-09-17 ENCOUNTER — Encounter (HOSPITAL_COMMUNITY): Payer: Self-pay | Admitting: Gastroenterology

## 2018-09-17 DIAGNOSIS — K8050 Calculus of bile duct without cholangitis or cholecystitis without obstruction: Secondary | ICD-10-CM | POA: Diagnosis not present

## 2018-09-17 DIAGNOSIS — K859 Acute pancreatitis without necrosis or infection, unspecified: Secondary | ICD-10-CM

## 2018-09-17 DIAGNOSIS — Z9889 Other specified postprocedural states: Secondary | ICD-10-CM | POA: Diagnosis not present

## 2018-09-17 DIAGNOSIS — N183 Chronic kidney disease, stage 3 (moderate): Secondary | ICD-10-CM | POA: Diagnosis not present

## 2018-09-17 DIAGNOSIS — K831 Obstruction of bile duct: Secondary | ICD-10-CM | POA: Diagnosis not present

## 2018-09-17 DIAGNOSIS — G43909 Migraine, unspecified, not intractable, without status migrainosus: Secondary | ICD-10-CM | POA: Diagnosis not present

## 2018-09-17 DIAGNOSIS — K851 Biliary acute pancreatitis without necrosis or infection: Secondary | ICD-10-CM | POA: Diagnosis not present

## 2018-09-17 DIAGNOSIS — G43709 Chronic migraine without aura, not intractable, without status migrainosus: Secondary | ICD-10-CM | POA: Diagnosis not present

## 2018-09-17 DIAGNOSIS — R7989 Other specified abnormal findings of blood chemistry: Secondary | ICD-10-CM | POA: Diagnosis not present

## 2018-09-17 DIAGNOSIS — E039 Hypothyroidism, unspecified: Secondary | ICD-10-CM | POA: Diagnosis not present

## 2018-09-17 DIAGNOSIS — Z9049 Acquired absence of other specified parts of digestive tract: Secondary | ICD-10-CM | POA: Diagnosis not present

## 2018-09-17 DIAGNOSIS — R1013 Epigastric pain: Secondary | ICD-10-CM | POA: Diagnosis not present

## 2018-09-17 DIAGNOSIS — R109 Unspecified abdominal pain: Secondary | ICD-10-CM | POA: Diagnosis not present

## 2018-09-17 LAB — LIPASE, BLOOD: LIPASE: 102 U/L — AB (ref 11–51)

## 2018-09-17 LAB — HEPATIC FUNCTION PANEL
ALT: 245 U/L — AB (ref 0–44)
AST: 36 U/L (ref 15–41)
Albumin: 3 g/dL — ABNORMAL LOW (ref 3.5–5.0)
Alkaline Phosphatase: 154 U/L — ABNORMAL HIGH (ref 38–126)
BILIRUBIN DIRECT: 2.5 mg/dL — AB (ref 0.0–0.2)
BILIRUBIN INDIRECT: 1.2 mg/dL — AB (ref 0.3–0.9)
Total Bilirubin: 3.7 mg/dL — ABNORMAL HIGH (ref 0.3–1.2)
Total Protein: 5.9 g/dL — ABNORMAL LOW (ref 6.5–8.1)

## 2018-09-17 LAB — GLUCOSE, CAPILLARY: GLUCOSE-CAPILLARY: 115 mg/dL — AB (ref 70–99)

## 2018-09-17 MED ORDER — ONDANSETRON HCL 4 MG PO TABS: 4.0000 mg | ORAL_TABLET | Freq: Three times a day (TID) | ORAL | 0 refills | Status: AC | PRN

## 2018-09-17 MED ORDER — TOPIRAMATE 25 MG PO CPSP: 25.0000 mg | ORAL_CAPSULE | Freq: Two times a day (BID) | ORAL | Status: AC

## 2018-09-17 NOTE — Progress Notes (Signed)
Abigail Sexton to be D/C'd  per MD order. Discussed with the patient and all questions fully answered.  VSS, Skin clean, dry and intact without evidence of skin break down, no evidence of skin tears noted.  IV catheter discontinued intact. Site without signs and symptoms of complications. Dressing and pressure applied.  An After Visit Summary was printed and given to the patient. Patient received prescription.  D/c education completed with patient/family including follow up instructions, medication list, d/c activities limitations if indicated, with other d/c instructions as indicated by MD - patient able to verbalize understanding, all questions fully answered.   Patient instructed to return to ED, call 911, or call MD for any changes in condition.   Patient to be escorted via WC, and D/C home via private auto.

## 2018-09-17 NOTE — Progress Notes (Signed)
Subjective: The patient was seen and examined at bedside. She was on regular diet yesterday evening. Denies any ongoing abdominal pain, except occasional mild pain about 4 out of 10 in intensity.  Objective: Vital signs in last 24 hours: Temp:  [98 F (36.7 C)-98.3 F (36.8 C)] 98 F (36.7 C) (09/28 0442) Pulse Rate:  [74-84] 84 (09/28 0442) Resp:  [16-20] 20 (09/28 0442) BP: (121-148)/(80-95) 148/95 (09/28 0442) SpO2:  [99 %-100 %] 99 % (09/28 0442) Weight change:  Last BM Date: 09/14/18  UE:AVWU icterus, no pallor GENERAL:not in distress ABDOMEN:soft, nondistended, nontender EXTREMITIES:no deformity  Lab Results: Results for orders placed or performed during the hospital encounter of 09/14/18 (from the past 48 hour(s))  Comprehensive metabolic panel     Status: Abnormal   Collection Time: 09/16/18  6:17 AM  Result Value Ref Range   Sodium 138 135 - 145 mmol/L   Potassium 4.0 3.5 - 5.1 mmol/L   Chloride 113 (H) 98 - 111 mmol/L   CO2 19 (L) 22 - 32 mmol/L   Glucose, Bld 91 70 - 99 mg/dL   BUN 5 (L) 6 - 20 mg/dL   Creatinine, Ser 0.90 0.44 - 1.00 mg/dL   Calcium 8.9 8.9 - 10.3 mg/dL   Total Protein 5.6 (L) 6.5 - 8.1 g/dL   Albumin 2.9 (L) 3.5 - 5.0 g/dL   AST 71 (H) 15 - 41 U/L   ALT 336 (H) 0 - 44 U/L   Alkaline Phosphatase 160 (H) 38 - 126 U/L   Total Bilirubin 4.2 (H) 0.3 - 1.2 mg/dL   GFR calc non Af Amer >60 >60 mL/min   GFR calc Af Amer >60 >60 mL/min    Comment: (NOTE) The eGFR has been calculated using the CKD EPI equation. This calculation has not been validated in all clinical situations. eGFR's persistently <60 mL/min signify possible Chronic Kidney Disease.    Anion gap 6 5 - 15    Comment: Performed at [facility] [address], [place], [place] [address]  Lipase, blood     Status: Abnormal   Collection Time: 09/16/18  6:17 AM  Result Value Ref Range   Lipase 81 (H) 11 - 51 U/L    Comment: Performed at [facility] [address] [place], [place] [address]  Glucose, capillary     Status: None   Collection Time: 09/16/18 10:09 AM  Result Value Ref Range   Glucose-Capillary 95 70 - 99 mg/dL  Lipase, blood     Status: Abnormal   Collection Time: 09/17/18  5:43 AM  Result Value Ref Range   Lipase 102 (H) 11 - 51 U/L    Comment: Performed at [facility], [place] [address], [place], [place] [address]  Hepatic function panel     Status: Abnormal   Collection Time: 09/17/18  5:43 AM  Result Value Ref Range   Total Protein 5.9 (L) 6.5 - 8.1 g/dL   Albumin 3.0 (L) 3.5 - 5.0 g/dL   AST 36 15 - 41 U/L   ALT 245 (H) 0 - 44 U/L   Alkaline Phosphatase 154 (H) 38 - 126 U/L   Total Bilirubin 3.7 (H) 0.3 - 1.2 mg/dL   Bilirubin, Direct 2.5 (H) 0.0 - 0.2 mg/dL   Indirect Bilirubin 1.2 (H) 0.3 - 0.9 mg/dL    Comment: Performed at [facility] [address], [place], [place] [address]  Glucose, capillary     Status:  Abnormal   Collection Time: 09/17/18  8:18 AM  Result Value Ref Range   Glucose-Capillary 115 (H) 70 - 99 mg/dL    Studies/Results: Dg Ercp Biliary & Pancreatic Ducts  Result Date: 09/16/2018 CLINICAL DATA:  Choledocholithiasis and acute pancreatitis. EXAM: ERCP TECHNIQUE: Multiple spot images obtained with the fluoroscopic device and submitted for interpretation post-procedure. COMPARISON:  MRCP on 09/14/2018 FINDINGS: Images obtained with a C-arm during the procedure demonstrate cannulation and injection of the common bile duct demonstrating mild diffuse dilatation of the CBD with discrete filling defects identified consistent with calculi. There appears to be 2 discrete filling defects. Balloon sweep maneuver was performed to remove calculi with completion cholangiogram showing normal patency of the common bile duct without further filling defects. IMPRESSION: Choledocholithiasis with at least 2 discrete filling defects identified in the common bile duct. Balloon sweep maneuver was performed to extract  calculi. These images were submitted for radiologic interpretation only. Please see the procedural report for the amount of contrast and the fluoroscopy time utilized. Electronically Signed   By: Aletta Edouard M.D.   On: 09/16/2018 09:26    Medications: I have reviewed the patient's current medications.  Assessment: 1.CBD stones, post sphincterotomy and balloon extraction Tolerating a low-fat diet Improvement in liver enzymes 2. Pancreatitis  Plan: Okay to discharge from GI standpoint.   Abigail Sexton 09/17/2018, 9:14 AM   Pager [phone] If no answer or after 5 PM call [phone]

## 2018-09-17 NOTE — Plan of Care (Signed)

## 2018-09-17 NOTE — Discharge Summary (Signed)
Physician Discharge Summary  Patient ID: Abigail Sexton MRN: [id number] DOB/AGE: [birth year] 57 y.o.  Admit date: 09/14/2018 Discharge date: 09/17/2018  Admission Diagnoses:  Active Problems:   Acute pancreatitis due to common bile duct stone   Elevated LFT"s   Abd pain   CKD (chronic kidney disease), stage III (HCC)   Hypothyroidism   Chronic migraine  Discharge Diagnoses:   Acute pancreatitis due to common bile duct stones  H/o ERCP  H/o removal of CBD stones  H/o sphincterotomy of duct of Oddi  Elevated LFT's  CKD III  Chronic migraines  Hypothyroidism   Discharged Condition: good   Presenting Summary: [name] is a 57 y.o. F with hypothyroidism who presents with intermittent post-prandial epigastric pain, finally developed severe constant epigastric pain and presented to [facility], also with elevated LFTs and dilated CBD on imaging.       Problems/ Hospital Course:  Gallstone/ CBD stone-related acute pancreatitis MRCP showed two 1 cm gallstones in CBD and pancreatitis. Underwent ERCP 9/27 w/ sphincterotomy of sphincter of Oddi,  retrieval of 2 CBD stones and retrieval of CBD sludge.  Tbili, lipase and AST/ ALT were very high on admission, much better at dc though.   Did well post procedure w/ improved pain/ nausea.  Ready for dc today per GI.  - low fat diet - prn pain meds -- f/u per GI   Transaminitis -- Acute hepatitis negative - LFT's / lipase all improving  Hypothyroidism -Continue Armour Thyroid  Other medications -Continue topiramate -Requip as needed for migraines   Discharge Exam: Blood pressure (!) 134/94, pulse 95, temperature 98.3 F (36.8 C), temperature source Oral, resp. rate 20, height 5\' 7"  (1.702 m), weight 91.9 kg, SpO2 100 %.  alert, no distress  no jvd  Chest cta bilat  Cor reg no mrg  Abd soft ntnd +bs  Ext no edema  Disposition: Discharge disposition: 01-Home or Self Care        Allergies as of 09/17/2018       Reactions   Ciprofloxacin Nausea Only      Medication List    STOP taking these medications   pantoprazole 40 MG tablet Commonly known as:  PROTONIX     TAKE these medications   ARMOUR THYROID 90 MG tablet Generic drug:  thyroid Take 90 mg by mouth daily.   MIMVEY 1-0.5 MG tablet Generic drug:  estradiol-norethindrone Take 1 tablet by mouth daily.   montelukast 10 MG tablet Commonly known as:  SINGULAIR Take 10 mg by mouth daily.   ondansetron 4 MG tablet Commonly known as:  ZOFRAN Take 1 tablet (4 mg total) by mouth every 8 (eight) hours as needed for nausea or vomiting.   OVER THE COUNTER MEDICATION Take 1 drop by mouth every morning. CBD Oil   SAVELLA 50 MG Tabs tablet Generic drug:  Milnacipran Take 75 mg by mouth 2 (two) times daily.   topiramate 25 MG capsule Commonly known as:  TOPAMAX Take 1-2 capsules (25-50 mg total) by mouth 2 (two) times daily. What changed:    how much to take  when to take this   zaleplon 10 MG capsule Commonly known as:  SONATA Take 10 mg by mouth at bedtime.        Signed: Barbette Hair Nicholad Kautzman 09/17/2018, 12:46 PM

## 2019-02-28 DIAGNOSIS — Z1331 Encounter for screening for depression: Secondary | ICD-10-CM | POA: Diagnosis not present

## 2019-02-28 DIAGNOSIS — N289 Disorder of kidney and ureter, unspecified: Secondary | ICD-10-CM | POA: Diagnosis not present

## 2019-02-28 DIAGNOSIS — E039 Hypothyroidism, unspecified: Secondary | ICD-10-CM | POA: Diagnosis not present

## 2019-02-28 DIAGNOSIS — E785 Hyperlipidemia, unspecified: Secondary | ICD-10-CM | POA: Diagnosis not present

## 2019-02-28 DIAGNOSIS — M7918 Myalgia, other site: Secondary | ICD-10-CM | POA: Diagnosis not present

## 2019-09-05 DIAGNOSIS — M7918 Myalgia, other site: Secondary | ICD-10-CM | POA: Diagnosis not present

## 2019-09-05 DIAGNOSIS — R300 Dysuria: Secondary | ICD-10-CM | POA: Diagnosis not present

## 2019-09-05 DIAGNOSIS — E785 Hyperlipidemia, unspecified: Secondary | ICD-10-CM | POA: Diagnosis not present

## 2019-09-05 DIAGNOSIS — N289 Disorder of kidney and ureter, unspecified: Secondary | ICD-10-CM | POA: Diagnosis not present

## 2019-09-05 DIAGNOSIS — E039 Hypothyroidism, unspecified: Secondary | ICD-10-CM | POA: Diagnosis not present

## 2019-09-13 DIAGNOSIS — Z01419 Encounter for gynecological examination (general) (routine) without abnormal findings: Secondary | ICD-10-CM | POA: Diagnosis not present

## 2019-09-16 DIAGNOSIS — Z1231 Encounter for screening mammogram for malignant neoplasm of breast: Secondary | ICD-10-CM | POA: Diagnosis not present

## 2019-11-17 DIAGNOSIS — N390 Urinary tract infection, site not specified: Secondary | ICD-10-CM | POA: Diagnosis not present

## 2019-11-17 DIAGNOSIS — R300 Dysuria: Secondary | ICD-10-CM | POA: Diagnosis not present

## 2019-11-17 DIAGNOSIS — K5900 Constipation, unspecified: Secondary | ICD-10-CM | POA: Diagnosis not present

## 2019-11-17 DIAGNOSIS — Z6830 Body mass index (BMI) 30.0-30.9, adult: Secondary | ICD-10-CM | POA: Diagnosis not present

## 2019-12-03 IMAGING — RF DG ERCP WO/W SPHINCTEROTOMY
1 series · 7 of 7 positions shown · non-contrast
Comparison: MRCP on 09/14/2018

CLINICAL DATA: Choledocholithiasis and acute pancreatitis.

EXAM:
ERCP
TECHNIQUE: Multiple spot images obtained with the fluoroscopic device and
submitted for interpretation post-procedure.

[Series 1: run · 7 of 7 slices shown]
[im 1/7]
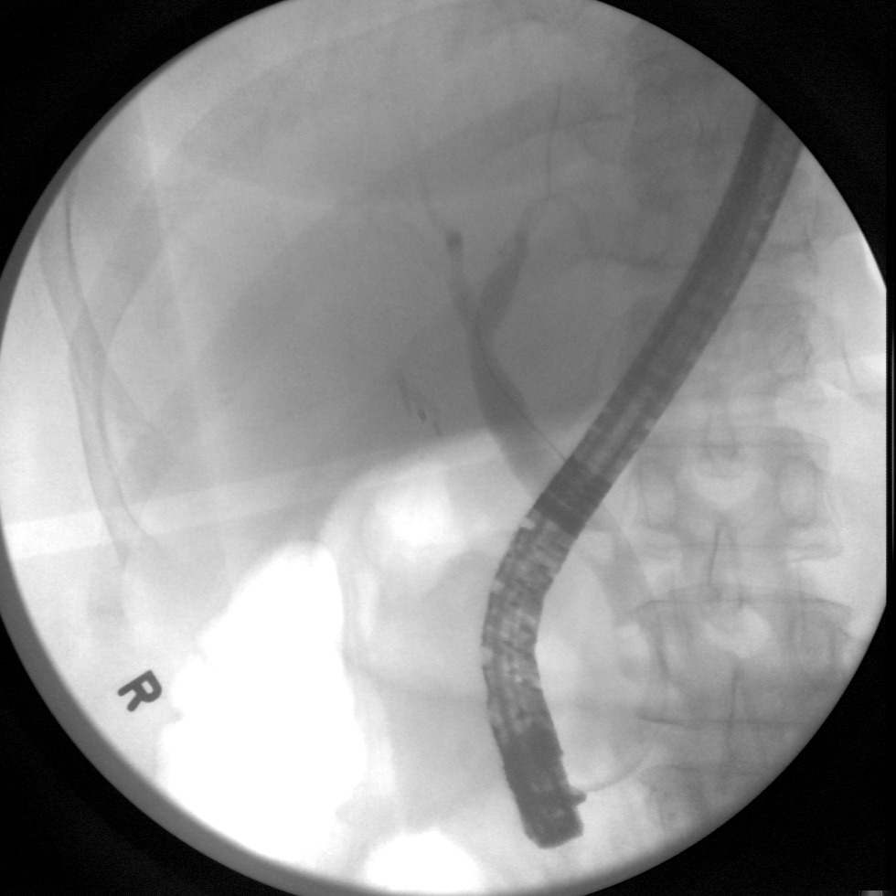
[im 2/7]
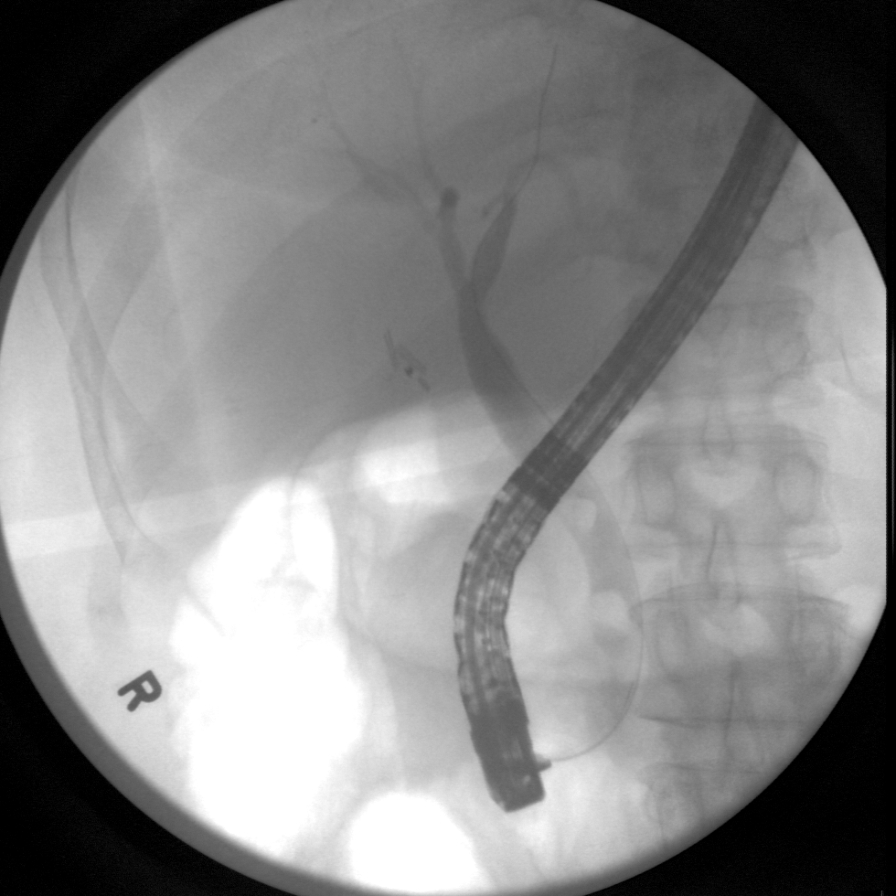
[im 3/7]
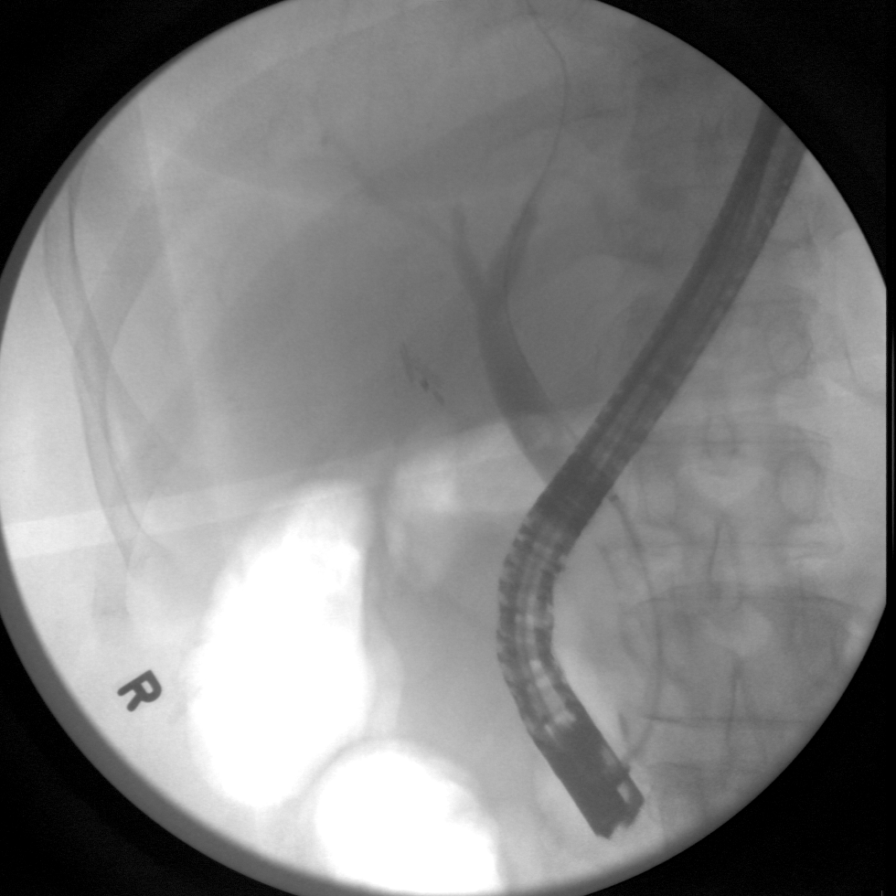
[im 4/7]
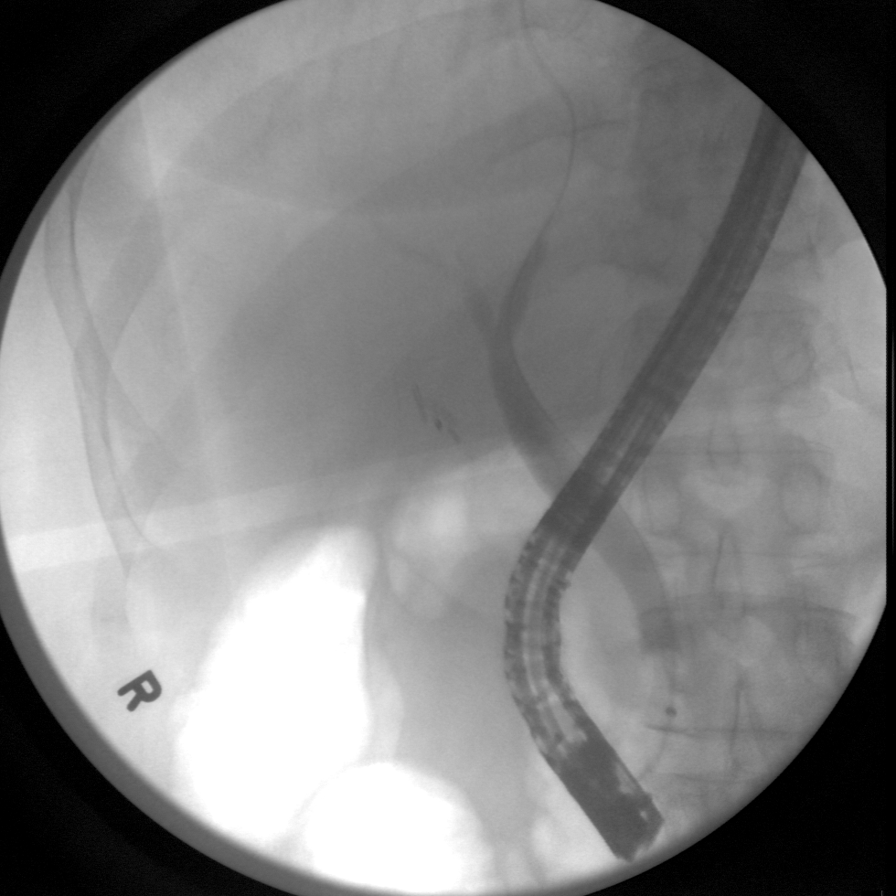
[im 5/7]
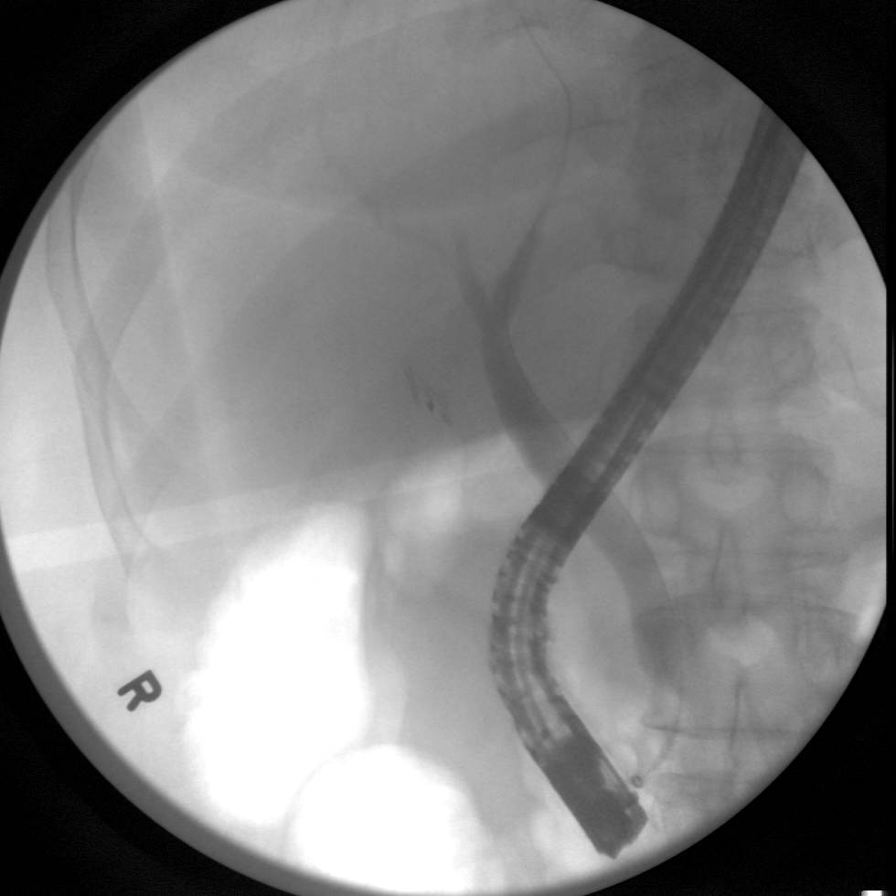
[im 6/7]
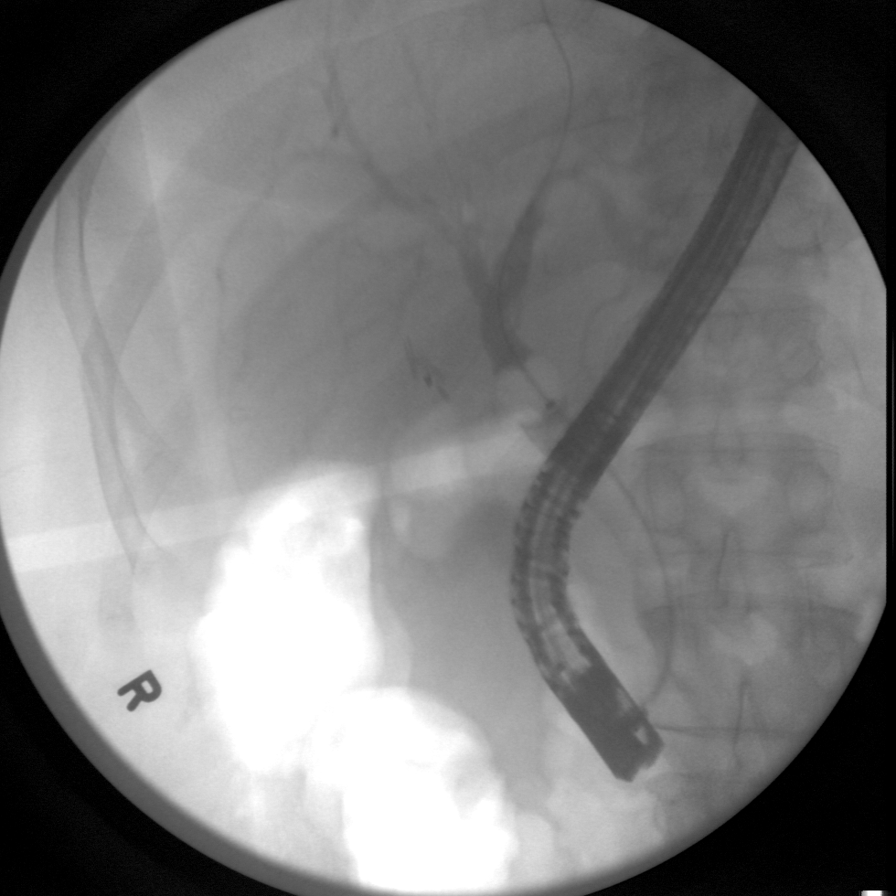
[im 7/7]
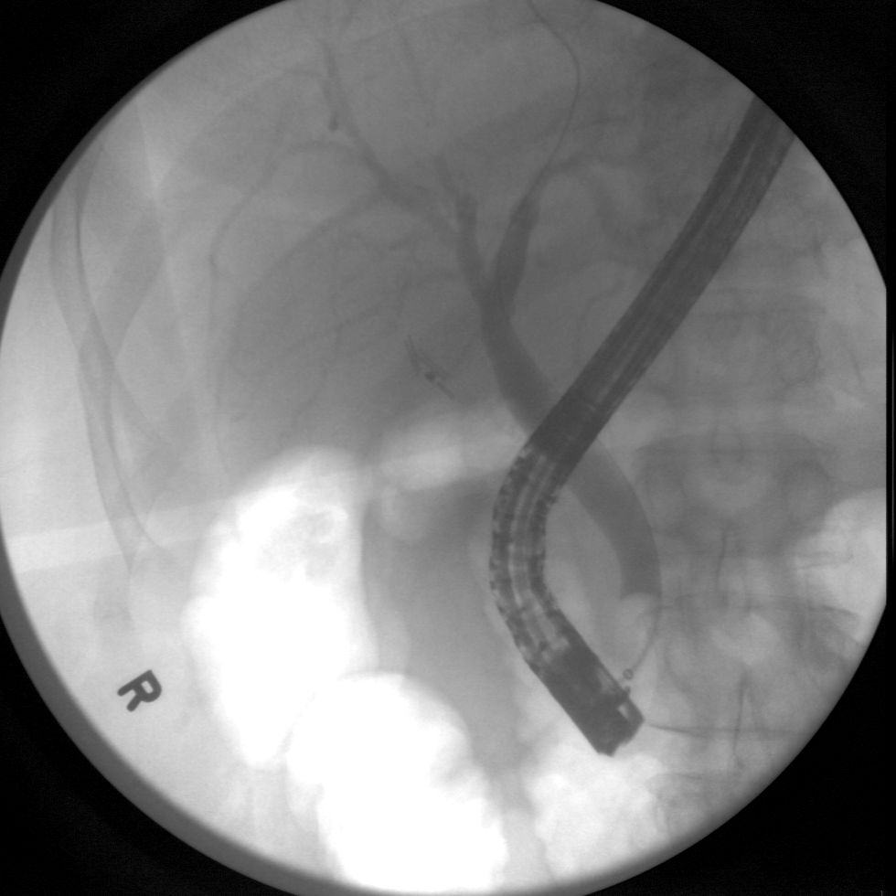

[7 of 7 positions shown; findings below may reference images not displayed]

FINDINGS: Images obtained with a C-arm during the procedure demonstrate
cannulation and injection of the common bile duct demonstrating mild
diffuse dilatation of the CBD with discrete filling defects
identified consistent with calculi. There appears to be 2 discrete
filling defects. Balloon sweep maneuver was performed to remove
calculi with completion cholangiogram showing normal patency of the
common bile duct without further filling defects.
IMPRESSION: Choledocholithiasis with at least 2 discrete filling defects
identified in the common bile duct. Balloon sweep maneuver was
performed to extract calculi.

These images were submitted for radiologic interpretation only.
Please see the procedural report for the amount of contrast and the
fluoroscopy time utilized.

## 2019-12-26 DIAGNOSIS — Z6831 Body mass index (BMI) 31.0-31.9, adult: Secondary | ICD-10-CM | POA: Diagnosis not present

## 2019-12-26 DIAGNOSIS — R300 Dysuria: Secondary | ICD-10-CM | POA: Diagnosis not present

## 2020-02-15 DIAGNOSIS — N3001 Acute cystitis with hematuria: Secondary | ICD-10-CM | POA: Diagnosis not present

## 2020-02-15 DIAGNOSIS — R1084 Generalized abdominal pain: Secondary | ICD-10-CM | POA: Diagnosis not present

## 2020-02-15 DIAGNOSIS — R300 Dysuria: Secondary | ICD-10-CM | POA: Diagnosis not present

## 2020-03-01 DIAGNOSIS — N289 Disorder of kidney and ureter, unspecified: Secondary | ICD-10-CM | POA: Diagnosis not present

## 2020-03-01 DIAGNOSIS — E785 Hyperlipidemia, unspecified: Secondary | ICD-10-CM | POA: Diagnosis not present

## 2020-03-01 DIAGNOSIS — M7918 Myalgia, other site: Secondary | ICD-10-CM | POA: Diagnosis not present

## 2020-03-01 DIAGNOSIS — E039 Hypothyroidism, unspecified: Secondary | ICD-10-CM | POA: Diagnosis not present

## 2020-08-29 DIAGNOSIS — S61215A Laceration without foreign body of left ring finger without damage to nail, initial encounter: Secondary | ICD-10-CM | POA: Diagnosis not present

## 2020-09-05 DIAGNOSIS — M7918 Myalgia, other site: Secondary | ICD-10-CM | POA: Diagnosis not present

## 2020-09-05 DIAGNOSIS — E785 Hyperlipidemia, unspecified: Secondary | ICD-10-CM | POA: Diagnosis not present

## 2020-09-05 DIAGNOSIS — E039 Hypothyroidism, unspecified: Secondary | ICD-10-CM | POA: Diagnosis not present

## 2020-09-05 DIAGNOSIS — E559 Vitamin D deficiency, unspecified: Secondary | ICD-10-CM | POA: Diagnosis not present

## 2020-09-05 DIAGNOSIS — N289 Disorder of kidney and ureter, unspecified: Secondary | ICD-10-CM | POA: Diagnosis not present

## 2020-09-05 DIAGNOSIS — Z79899 Other long term (current) drug therapy: Secondary | ICD-10-CM | POA: Diagnosis not present

## 2020-10-23 DIAGNOSIS — Z1231 Encounter for screening mammogram for malignant neoplasm of breast: Secondary | ICD-10-CM | POA: Diagnosis not present

## 2020-10-23 DIAGNOSIS — Z01419 Encounter for gynecological examination (general) (routine) without abnormal findings: Secondary | ICD-10-CM | POA: Diagnosis not present

## 2020-10-23 DIAGNOSIS — Z1239 Encounter for other screening for malignant neoplasm of breast: Secondary | ICD-10-CM | POA: Diagnosis not present

## 2020-10-28 DIAGNOSIS — N3091 Cystitis, unspecified with hematuria: Secondary | ICD-10-CM | POA: Diagnosis not present

## 2020-10-28 DIAGNOSIS — R21 Rash and other nonspecific skin eruption: Secondary | ICD-10-CM | POA: Diagnosis not present

## 2020-10-28 DIAGNOSIS — N3000 Acute cystitis without hematuria: Secondary | ICD-10-CM | POA: Diagnosis not present

## 2020-11-07 DIAGNOSIS — R928 Other abnormal and inconclusive findings on diagnostic imaging of breast: Secondary | ICD-10-CM | POA: Diagnosis not present

## 2020-12-23 DIAGNOSIS — R300 Dysuria: Secondary | ICD-10-CM | POA: Diagnosis not present

## 2021-02-03 DIAGNOSIS — N3090 Cystitis, unspecified without hematuria: Secondary | ICD-10-CM | POA: Diagnosis not present

## 2021-02-03 DIAGNOSIS — N3001 Acute cystitis with hematuria: Secondary | ICD-10-CM | POA: Diagnosis not present

## 2021-03-06 DIAGNOSIS — E785 Hyperlipidemia, unspecified: Secondary | ICD-10-CM | POA: Diagnosis not present

## 2021-03-06 DIAGNOSIS — M7918 Myalgia, other site: Secondary | ICD-10-CM | POA: Diagnosis not present

## 2021-03-06 DIAGNOSIS — Z79899 Other long term (current) drug therapy: Secondary | ICD-10-CM | POA: Diagnosis not present

## 2021-03-06 DIAGNOSIS — N289 Disorder of kidney and ureter, unspecified: Secondary | ICD-10-CM | POA: Diagnosis not present

## 2021-03-06 DIAGNOSIS — E039 Hypothyroidism, unspecified: Secondary | ICD-10-CM | POA: Diagnosis not present

## 2021-04-04 DIAGNOSIS — Z800 Family history of malignant neoplasm of digestive organs: Secondary | ICD-10-CM | POA: Diagnosis not present

## 2021-04-04 DIAGNOSIS — Z1211 Encounter for screening for malignant neoplasm of colon: Secondary | ICD-10-CM | POA: Diagnosis not present

## 2021-04-05 DIAGNOSIS — M549 Dorsalgia, unspecified: Secondary | ICD-10-CM | POA: Diagnosis not present

## 2021-04-05 DIAGNOSIS — R300 Dysuria: Secondary | ICD-10-CM | POA: Diagnosis not present

## 2021-04-05 DIAGNOSIS — N3091 Cystitis, unspecified with hematuria: Secondary | ICD-10-CM | POA: Diagnosis not present

## 2021-08-19 DIAGNOSIS — N3091 Cystitis, unspecified with hematuria: Secondary | ICD-10-CM | POA: Diagnosis not present

## 2021-08-19 DIAGNOSIS — R300 Dysuria: Secondary | ICD-10-CM | POA: Diagnosis not present

## 2021-09-11 DIAGNOSIS — E039 Hypothyroidism, unspecified: Secondary | ICD-10-CM | POA: Diagnosis not present

## 2021-09-11 DIAGNOSIS — M7918 Myalgia, other site: Secondary | ICD-10-CM | POA: Diagnosis not present

## 2021-09-11 DIAGNOSIS — N289 Disorder of kidney and ureter, unspecified: Secondary | ICD-10-CM | POA: Diagnosis not present

## 2021-09-11 DIAGNOSIS — E785 Hyperlipidemia, unspecified: Secondary | ICD-10-CM | POA: Diagnosis not present

## 2021-09-11 DIAGNOSIS — Z79899 Other long term (current) drug therapy: Secondary | ICD-10-CM | POA: Diagnosis not present

## 2021-10-02 DIAGNOSIS — R300 Dysuria: Secondary | ICD-10-CM | POA: Diagnosis not present

## 2021-10-02 DIAGNOSIS — N3090 Cystitis, unspecified without hematuria: Secondary | ICD-10-CM | POA: Diagnosis not present

## 2021-10-13 DIAGNOSIS — E669 Obesity, unspecified: Secondary | ICD-10-CM | POA: Diagnosis not present

## 2021-10-13 DIAGNOSIS — R635 Abnormal weight gain: Secondary | ICD-10-CM | POA: Diagnosis not present

## 2021-11-10 DIAGNOSIS — R635 Abnormal weight gain: Secondary | ICD-10-CM | POA: Diagnosis not present

## 2021-11-10 DIAGNOSIS — E669 Obesity, unspecified: Secondary | ICD-10-CM | POA: Diagnosis not present

## 2022-02-13 DIAGNOSIS — Z1231 Encounter for screening mammogram for malignant neoplasm of breast: Secondary | ICD-10-CM | POA: Diagnosis not present

## 2022-03-02 DIAGNOSIS — Z124 Encounter for screening for malignant neoplasm of cervix: Secondary | ICD-10-CM | POA: Diagnosis not present

## 2022-03-02 DIAGNOSIS — Z01419 Encounter for gynecological examination (general) (routine) without abnormal findings: Secondary | ICD-10-CM | POA: Diagnosis not present

## 2022-03-12 DIAGNOSIS — Z79899 Other long term (current) drug therapy: Secondary | ICD-10-CM | POA: Diagnosis not present

## 2022-03-12 DIAGNOSIS — E039 Hypothyroidism, unspecified: Secondary | ICD-10-CM | POA: Diagnosis not present

## 2022-03-12 DIAGNOSIS — M7918 Myalgia, other site: Secondary | ICD-10-CM | POA: Diagnosis not present

## 2022-03-12 DIAGNOSIS — E559 Vitamin D deficiency, unspecified: Secondary | ICD-10-CM | POA: Diagnosis not present

## 2022-03-12 DIAGNOSIS — E785 Hyperlipidemia, unspecified: Secondary | ICD-10-CM | POA: Diagnosis not present

## 2022-03-12 DIAGNOSIS — N289 Disorder of kidney and ureter, unspecified: Secondary | ICD-10-CM | POA: Diagnosis not present

## 2022-03-25 DIAGNOSIS — M25551 Pain in right hip: Secondary | ICD-10-CM | POA: Diagnosis not present

## 2022-04-01 DIAGNOSIS — M25551 Pain in right hip: Secondary | ICD-10-CM | POA: Diagnosis not present

## 2022-04-01 DIAGNOSIS — M6281 Muscle weakness (generalized): Secondary | ICD-10-CM | POA: Diagnosis not present

## 2022-04-02 DIAGNOSIS — D225 Melanocytic nevi of trunk: Secondary | ICD-10-CM | POA: Diagnosis not present

## 2022-04-02 DIAGNOSIS — L219 Seborrheic dermatitis, unspecified: Secondary | ICD-10-CM | POA: Diagnosis not present

## 2022-04-02 DIAGNOSIS — L578 Other skin changes due to chronic exposure to nonionizing radiation: Secondary | ICD-10-CM | POA: Diagnosis not present

## 2022-04-02 DIAGNOSIS — D485 Neoplasm of uncertain behavior of skin: Secondary | ICD-10-CM | POA: Diagnosis not present

## 2022-04-08 DIAGNOSIS — M25551 Pain in right hip: Secondary | ICD-10-CM | POA: Diagnosis not present

## 2022-04-08 DIAGNOSIS — M6281 Muscle weakness (generalized): Secondary | ICD-10-CM | POA: Diagnosis not present

## 2022-04-15 DIAGNOSIS — M6281 Muscle weakness (generalized): Secondary | ICD-10-CM | POA: Diagnosis not present

## 2022-04-15 DIAGNOSIS — M25551 Pain in right hip: Secondary | ICD-10-CM | POA: Diagnosis not present

## 2022-04-16 DIAGNOSIS — E669 Obesity, unspecified: Secondary | ICD-10-CM | POA: Diagnosis not present

## 2022-04-16 DIAGNOSIS — G4700 Insomnia, unspecified: Secondary | ICD-10-CM | POA: Diagnosis not present

## 2022-10-14 DIAGNOSIS — R300 Dysuria: Secondary | ICD-10-CM | POA: Diagnosis not present

## 2022-10-14 DIAGNOSIS — N3091 Cystitis, unspecified with hematuria: Secondary | ICD-10-CM | POA: Diagnosis not present

## 2022-10-14 DIAGNOSIS — M549 Dorsalgia, unspecified: Secondary | ICD-10-CM | POA: Diagnosis not present

## 2023-03-22 DIAGNOSIS — R0981 Nasal congestion: Secondary | ICD-10-CM | POA: Diagnosis not present

## 2023-03-22 DIAGNOSIS — J309 Allergic rhinitis, unspecified: Secondary | ICD-10-CM | POA: Diagnosis not present

## 2023-03-22 DIAGNOSIS — R051 Acute cough: Secondary | ICD-10-CM | POA: Diagnosis not present

## 2023-03-22 DIAGNOSIS — H6691 Otitis media, unspecified, right ear: Secondary | ICD-10-CM | POA: Diagnosis not present

## 2023-04-05 DIAGNOSIS — D225 Melanocytic nevi of trunk: Secondary | ICD-10-CM | POA: Diagnosis not present

## 2023-04-05 DIAGNOSIS — L578 Other skin changes due to chronic exposure to nonionizing radiation: Secondary | ICD-10-CM | POA: Diagnosis not present

## 2023-04-05 DIAGNOSIS — D485 Neoplasm of uncertain behavior of skin: Secondary | ICD-10-CM | POA: Diagnosis not present

## 2023-04-10 DIAGNOSIS — H6691 Otitis media, unspecified, right ear: Secondary | ICD-10-CM | POA: Diagnosis not present

## 2023-04-26 DIAGNOSIS — E785 Hyperlipidemia, unspecified: Secondary | ICD-10-CM | POA: Diagnosis not present

## 2023-04-26 DIAGNOSIS — H9201 Otalgia, right ear: Secondary | ICD-10-CM | POA: Diagnosis not present

## 2023-06-16 DIAGNOSIS — Z01419 Encounter for gynecological examination (general) (routine) without abnormal findings: Secondary | ICD-10-CM | POA: Diagnosis not present

## 2023-06-16 DIAGNOSIS — Z1231 Encounter for screening mammogram for malignant neoplasm of breast: Secondary | ICD-10-CM | POA: Diagnosis not present

## 2023-06-16 DIAGNOSIS — N951 Menopausal and female climacteric states: Secondary | ICD-10-CM | POA: Diagnosis not present

## 2023-06-29 DIAGNOSIS — Z1231 Encounter for screening mammogram for malignant neoplasm of breast: Secondary | ICD-10-CM | POA: Diagnosis not present

## 2023-07-13 DIAGNOSIS — R300 Dysuria: Secondary | ICD-10-CM | POA: Diagnosis not present

## 2023-07-30 DIAGNOSIS — E559 Vitamin D deficiency, unspecified: Secondary | ICD-10-CM | POA: Diagnosis not present

## 2023-07-30 DIAGNOSIS — G4700 Insomnia, unspecified: Secondary | ICD-10-CM | POA: Diagnosis not present

## 2023-07-30 DIAGNOSIS — Z79899 Other long term (current) drug therapy: Secondary | ICD-10-CM | POA: Diagnosis not present

## 2023-07-30 DIAGNOSIS — G8929 Other chronic pain: Secondary | ICD-10-CM | POA: Diagnosis not present

## 2023-07-30 DIAGNOSIS — E785 Hyperlipidemia, unspecified: Secondary | ICD-10-CM | POA: Diagnosis not present

## 2023-07-30 DIAGNOSIS — E039 Hypothyroidism, unspecified: Secondary | ICD-10-CM | POA: Diagnosis not present

## 2023-11-08 DIAGNOSIS — E039 Hypothyroidism, unspecified: Secondary | ICD-10-CM | POA: Diagnosis not present

## 2023-11-08 DIAGNOSIS — E669 Obesity, unspecified: Secondary | ICD-10-CM | POA: Diagnosis not present

## 2023-11-08 DIAGNOSIS — E785 Hyperlipidemia, unspecified: Secondary | ICD-10-CM | POA: Diagnosis not present

## 2023-11-08 DIAGNOSIS — G4700 Insomnia, unspecified: Secondary | ICD-10-CM | POA: Diagnosis not present

## 2024-01-24 DIAGNOSIS — N952 Postmenopausal atrophic vaginitis: Secondary | ICD-10-CM | POA: Diagnosis not present

## 2024-03-27 DIAGNOSIS — N3001 Acute cystitis with hematuria: Secondary | ICD-10-CM | POA: Diagnosis not present

## 2024-03-27 DIAGNOSIS — R1084 Generalized abdominal pain: Secondary | ICD-10-CM | POA: Diagnosis not present

## 2024-03-27 DIAGNOSIS — R300 Dysuria: Secondary | ICD-10-CM | POA: Diagnosis not present

## 2024-04-06 DIAGNOSIS — D225 Melanocytic nevi of trunk: Secondary | ICD-10-CM | POA: Diagnosis not present

## 2024-04-06 DIAGNOSIS — L821 Other seborrheic keratosis: Secondary | ICD-10-CM | POA: Diagnosis not present

## 2024-05-09 DIAGNOSIS — M797 Fibromyalgia: Secondary | ICD-10-CM | POA: Diagnosis not present

## 2024-05-09 DIAGNOSIS — E785 Hyperlipidemia, unspecified: Secondary | ICD-10-CM | POA: Diagnosis not present

## 2024-05-09 DIAGNOSIS — G4700 Insomnia, unspecified: Secondary | ICD-10-CM | POA: Diagnosis not present

## 2024-05-09 DIAGNOSIS — E039 Hypothyroidism, unspecified: Secondary | ICD-10-CM | POA: Diagnosis not present

## 2024-05-09 DIAGNOSIS — E559 Vitamin D deficiency, unspecified: Secondary | ICD-10-CM | POA: Diagnosis not present

## 2024-07-12 DIAGNOSIS — Z01419 Encounter for gynecological examination (general) (routine) without abnormal findings: Secondary | ICD-10-CM | POA: Diagnosis not present

## 2024-07-12 DIAGNOSIS — Z1231 Encounter for screening mammogram for malignant neoplasm of breast: Secondary | ICD-10-CM | POA: Diagnosis not present

## 2024-07-16 DIAGNOSIS — H6990 Unspecified Eustachian tube disorder, unspecified ear: Secondary | ICD-10-CM | POA: Diagnosis not present

## 2024-07-16 DIAGNOSIS — J0100 Acute maxillary sinusitis, unspecified: Secondary | ICD-10-CM | POA: Diagnosis not present

## 2024-07-16 DIAGNOSIS — H6691 Otitis media, unspecified, right ear: Secondary | ICD-10-CM | POA: Diagnosis not present

## 2024-07-20 DIAGNOSIS — H9201 Otalgia, right ear: Secondary | ICD-10-CM | POA: Diagnosis not present

## 2024-07-20 DIAGNOSIS — Z6828 Body mass index (BMI) 28.0-28.9, adult: Secondary | ICD-10-CM | POA: Diagnosis not present

## 2024-08-02 DIAGNOSIS — H90A31 Mixed conductive and sensorineural hearing loss, unilateral, right ear with restricted hearing on the contralateral side: Secondary | ICD-10-CM | POA: Diagnosis not present

## 2024-08-02 DIAGNOSIS — H6991 Unspecified Eustachian tube disorder, right ear: Secondary | ICD-10-CM | POA: Diagnosis not present

## 2024-08-28 DIAGNOSIS — Z1231 Encounter for screening mammogram for malignant neoplasm of breast: Secondary | ICD-10-CM | POA: Diagnosis not present

## 2024-09-01 DIAGNOSIS — Z1231 Encounter for screening mammogram for malignant neoplasm of breast: Secondary | ICD-10-CM | POA: Diagnosis not present

## 2024-09-03 DIAGNOSIS — Z1231 Encounter for screening mammogram for malignant neoplasm of breast: Secondary | ICD-10-CM | POA: Diagnosis not present

## 2024-11-09 DIAGNOSIS — E785 Hyperlipidemia, unspecified: Secondary | ICD-10-CM | POA: Diagnosis not present

## 2024-11-09 DIAGNOSIS — G4700 Insomnia, unspecified: Secondary | ICD-10-CM | POA: Diagnosis not present

## 2024-11-09 DIAGNOSIS — E039 Hypothyroidism, unspecified: Secondary | ICD-10-CM | POA: Diagnosis not present

## 2024-11-09 DIAGNOSIS — R7303 Prediabetes: Secondary | ICD-10-CM | POA: Diagnosis not present

## 2024-11-09 DIAGNOSIS — M797 Fibromyalgia: Secondary | ICD-10-CM | POA: Diagnosis not present
